# Patient Record
Sex: Female | Born: 1940 | Race: White | Hispanic: No | Marital: Single | Smoking: Never smoker
Health system: Southern US, Community
[De-identification: ages and names within clinical notes are randomized; demographics above are authoritative.]

## PROBLEM LIST (undated history)

## (undated) DIAGNOSIS — I639 Cerebral infarction, unspecified: Secondary | ICD-10-CM

## (undated) DIAGNOSIS — I10 Essential (primary) hypertension: Secondary | ICD-10-CM

## (undated) DIAGNOSIS — C801 Malignant (primary) neoplasm, unspecified: Secondary | ICD-10-CM

## (undated) DIAGNOSIS — N3010 Interstitial cystitis (chronic) without hematuria: Secondary | ICD-10-CM

## (undated) DIAGNOSIS — M810 Age-related osteoporosis without current pathological fracture: Secondary | ICD-10-CM

## (undated) DIAGNOSIS — M1990 Unspecified osteoarthritis, unspecified site: Secondary | ICD-10-CM

## (undated) DIAGNOSIS — E7800 Pure hypercholesterolemia, unspecified: Secondary | ICD-10-CM

## (undated) HISTORY — DX: Essential (primary) hypertension: I10

## (undated) HISTORY — DX: Cerebral infarction, unspecified: I63.9

## (undated) HISTORY — DX: Pure hypercholesterolemia, unspecified: E78.00

## (undated) HISTORY — DX: Interstitial cystitis (chronic) without hematuria: N30.10

## (undated) HISTORY — PX: TUBAL LIGATION: SHX77

## (undated) HISTORY — DX: Age-related osteoporosis without current pathological fracture: M81.0

## (undated) HISTORY — PX: OTHER SURGICAL HISTORY: SHX169

---

## 1998-05-08 ENCOUNTER — Other Ambulatory Visit: Admission: RE | Admit: 1998-05-08 | Discharge: 1998-05-08 | Payer: Self-pay | Admitting: *Deleted

## 1999-06-10 ENCOUNTER — Other Ambulatory Visit: Admission: RE | Admit: 1999-06-10 | Discharge: 1999-06-10 | Payer: Self-pay | Admitting: *Deleted

## 2000-07-22 ENCOUNTER — Other Ambulatory Visit: Admission: RE | Admit: 2000-07-22 | Discharge: 2000-07-22 | Payer: Self-pay | Admitting: *Deleted

## 2001-07-26 ENCOUNTER — Other Ambulatory Visit: Admission: RE | Admit: 2001-07-26 | Discharge: 2001-07-26 | Payer: Self-pay | Admitting: *Deleted

## 2003-06-29 ENCOUNTER — Other Ambulatory Visit: Admission: RE | Admit: 2003-06-29 | Discharge: 2003-06-29 | Payer: Self-pay | Admitting: *Deleted

## 2004-07-11 ENCOUNTER — Other Ambulatory Visit: Admission: RE | Admit: 2004-07-11 | Discharge: 2004-07-11 | Payer: Self-pay | Admitting: *Deleted

## 2004-10-25 ENCOUNTER — Encounter: Admission: RE | Admit: 2004-10-25 | Discharge: 2004-10-25 | Payer: Self-pay | Admitting: Internal Medicine

## 2005-08-14 ENCOUNTER — Other Ambulatory Visit: Admission: RE | Admit: 2005-08-14 | Discharge: 2005-08-14 | Payer: Self-pay | Admitting: *Deleted

## 2006-07-13 ENCOUNTER — Encounter: Admission: RE | Admit: 2006-07-13 | Discharge: 2006-10-11 | Payer: Self-pay | Admitting: Family Medicine

## 2007-03-10 ENCOUNTER — Other Ambulatory Visit: Admission: RE | Admit: 2007-03-10 | Discharge: 2007-03-10 | Payer: Self-pay | Admitting: *Deleted

## 2008-06-07 ENCOUNTER — Other Ambulatory Visit: Admission: RE | Admit: 2008-06-07 | Discharge: 2008-06-07 | Payer: Self-pay | Admitting: Gynecology

## 2009-12-05 ENCOUNTER — Encounter: Admission: RE | Admit: 2009-12-05 | Discharge: 2009-12-05 | Payer: Self-pay | Admitting: Internal Medicine

## 2009-12-18 ENCOUNTER — Encounter: Admission: RE | Admit: 2009-12-18 | Discharge: 2009-12-18 | Payer: Self-pay | Admitting: Surgery

## 2009-12-18 ENCOUNTER — Other Ambulatory Visit: Admission: RE | Admit: 2009-12-18 | Discharge: 2009-12-18 | Payer: Self-pay | Admitting: Interventional Radiology

## 2011-08-27 IMAGING — CT CT NECK W/ CM
4 of 5 series · 16 of 33 positions shown, 19 images · IV contrast (agent unspecified)
Comparison: None

CLINICAL DATA: Right neck pain and swelling.

CT NECK WITH CONTRAST
TECHNIQUE: Multidetector CT imaging of the neck was performed with
intravenous contrast.
Contrast: 70 ml 5mnipaque-JAA

[Series 3: axial neck · axial · 0.38mm/px · z∈[-2,+90]mm · 3 of 94 slices shown]
[im 19/94  bone]
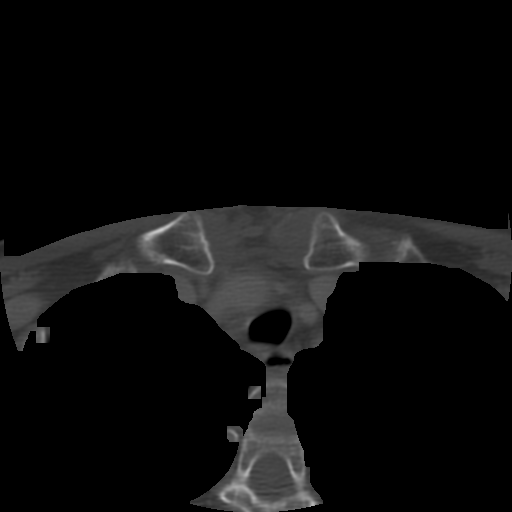
[im 38/94  bone]
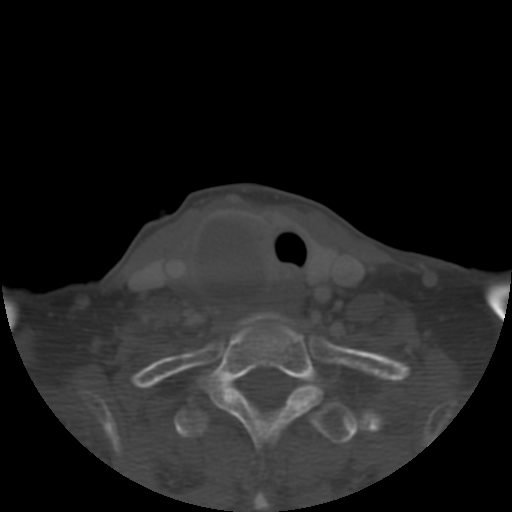
[im 56/94  bone]
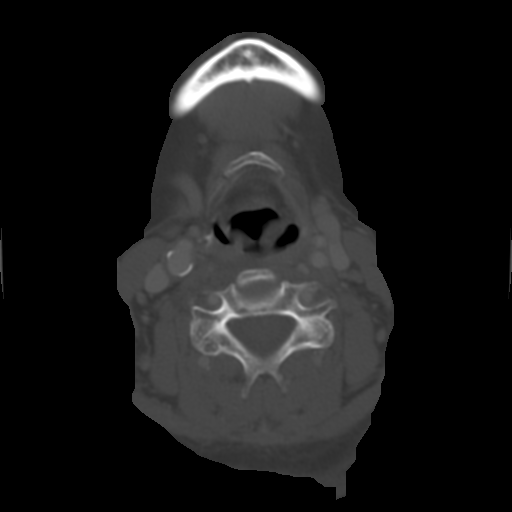

[Series 200: neck sag · sagittal · 0.47mm/px · 5 of 79 slices shown, 6 images]
[im 27/79  bone]
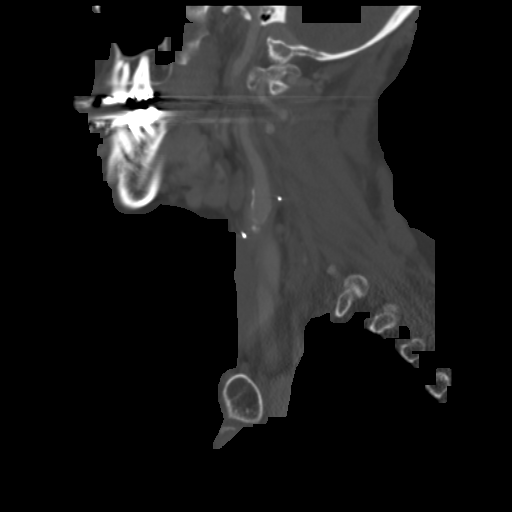
[im 33/79  bone]
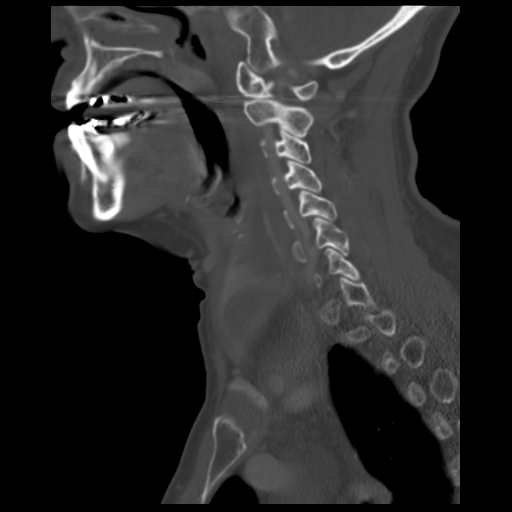
[im 40/79  soft-tissue]
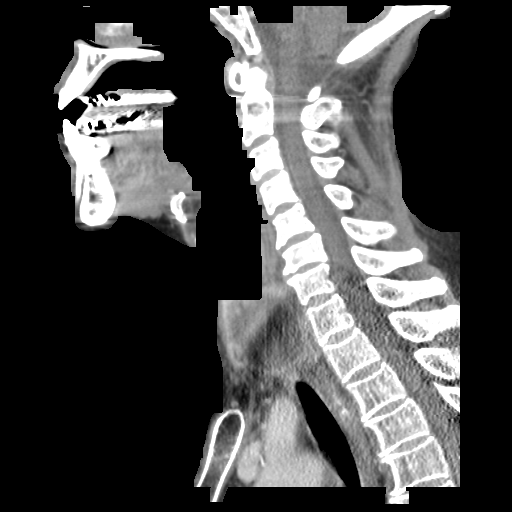
[im 40/79  bone]
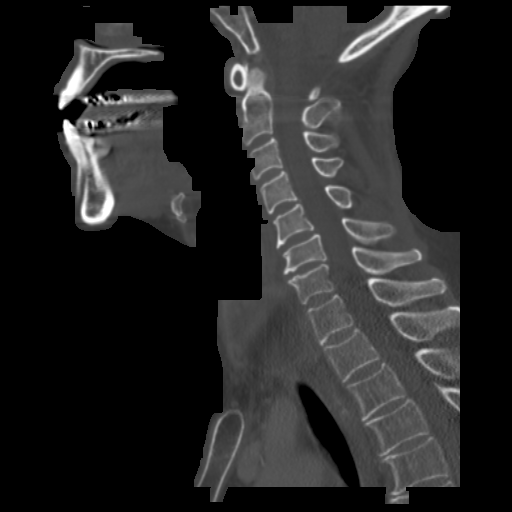
[im 46/79  bone]
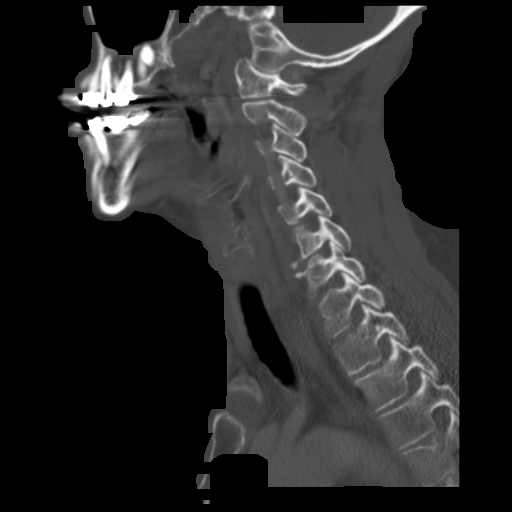
[im 53/79  bone]
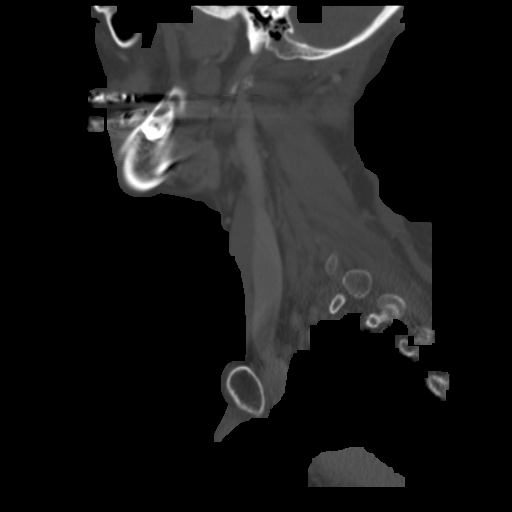

[Series 201: neck cor · coronal · 0.47mm/px · 3 of 75 slices shown]
[im 22/75  bone]
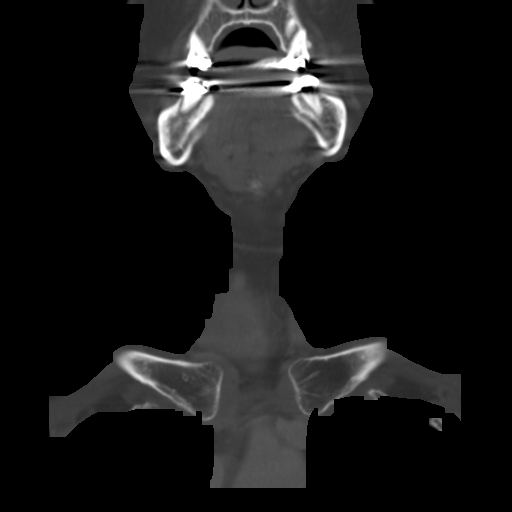
[im 32/75  bone]
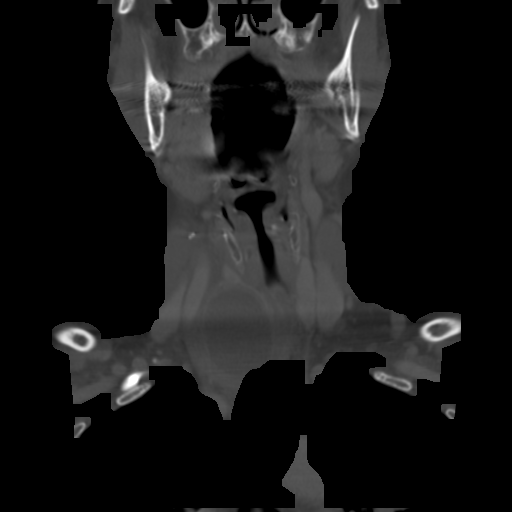
[im 42/75  bone]
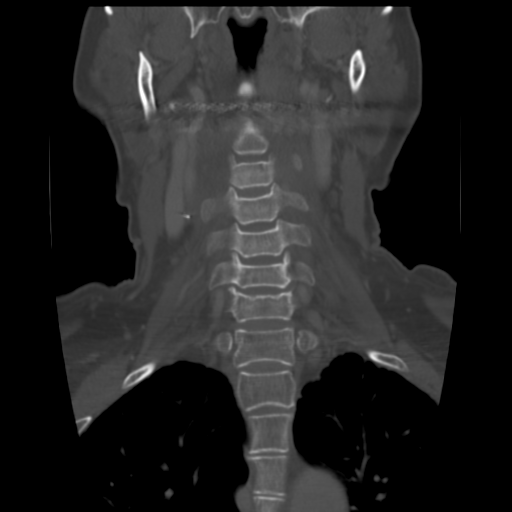

[Series 202: angled to hyoid · axial · 0.47mm/px · z∈[-41,+86]mm · 5 of 106 slices shown, 7 images]
[im 18/106  soft-tissue]
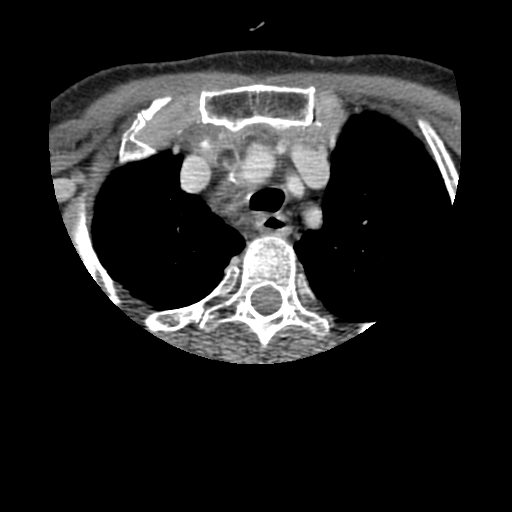
[im 18/106  bone]
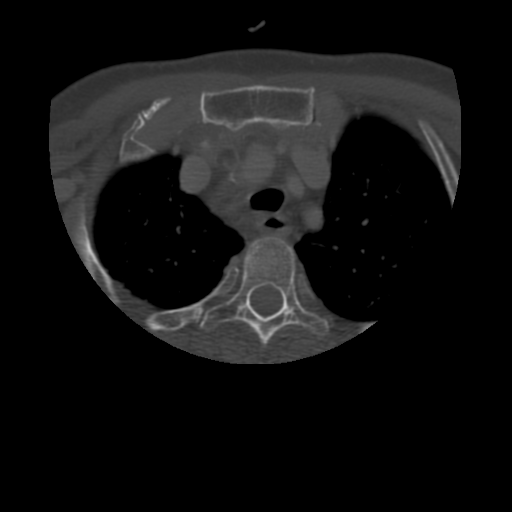
[im 36/106  bone]
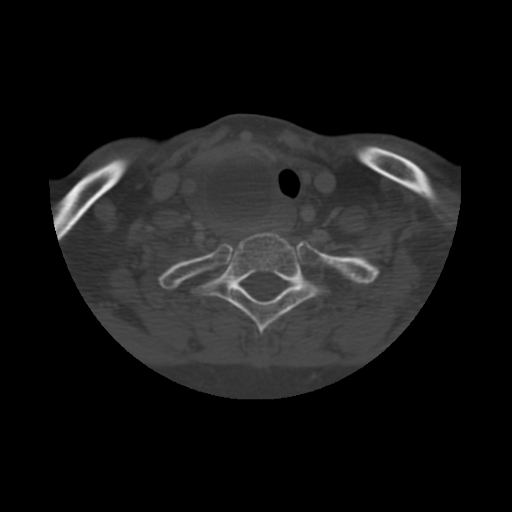
[im 53/106  bone]
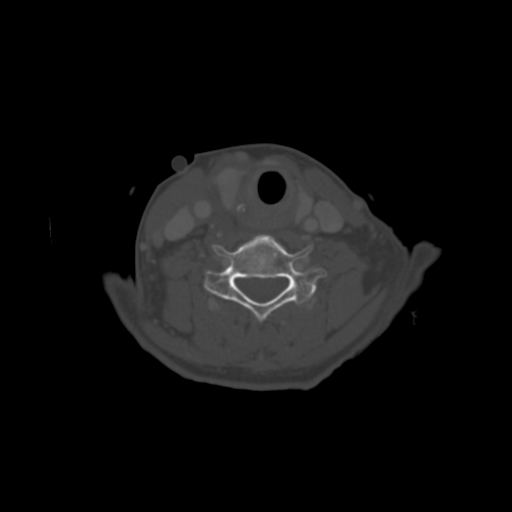
[im 71/106  bone]
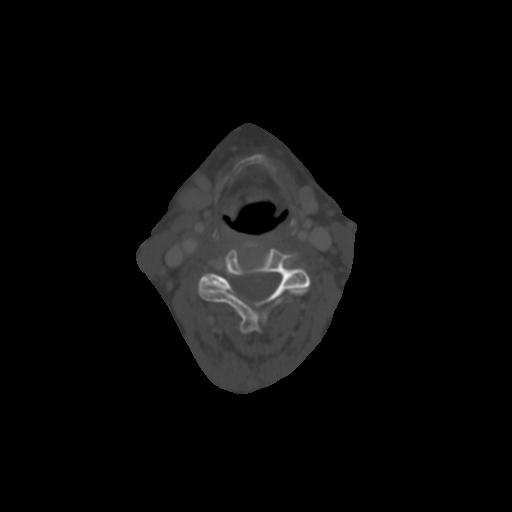
[im 88/106  soft-tissue]
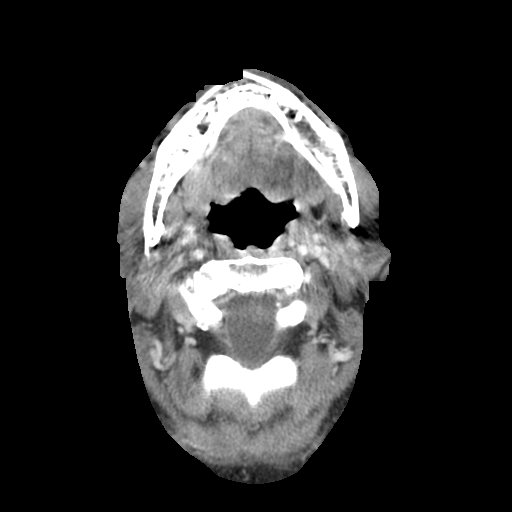
[im 88/106  bone]
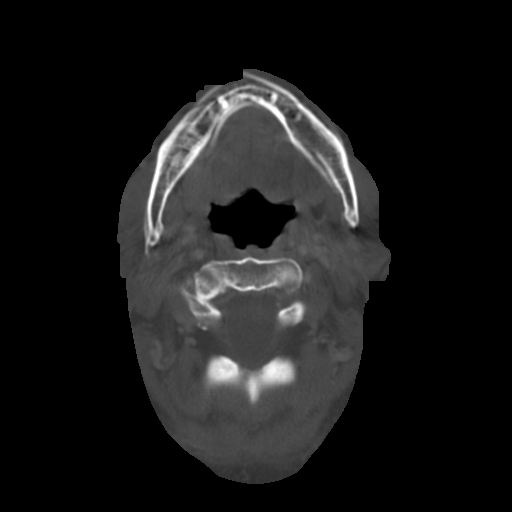

[16 of 33 positions shown; findings below may reference images not displayed]

FINDINGS: Apparently arising within the inferior aspect of the
right lobe of the thyroid, there is a cystic or necrotic lesion
measuring 4 cm AP, 3.5 cm right to left and 4.5 cm cephalocaudal.
Internal density appears homogeneous.  There does appear to be
perceptible wall thickness.  The differential diagnosis includes
necrotic tumor versus large degenerated adenoma versus abscess.
This exerts mass effect on the airway and the esophagus, deviating
them towards the left.  The right common carotid artery passes
along the margin of this lesion and could possibly be  adhesed.
The jugular vein is not affected.

The left lobe of the thyroid appears normal.

Posterior fossa intracranial structures appear unremarkable.
Parotid and submandibular glands appear normal.  No pathologically
enlarged lymph nodes are noted within the neck.  There is been
previous carotid endarterectomy.  The lung apices appear clear.
There is some fluid in the superior recess of the pericardium, not
completely evaluated.  Ordinary cervical spondylosis is noted at C5-
6 and C6-7.
IMPRESSION: Cystic or necrotic lesion apparently arising from the lower pole of
the thyroid on the right measuring 4 x 3.5 x 4.5 cm.  Displacement
of the trachea and the esophagus to the left.  This is worrisome
for a necrotic neoplasm.  The differential diagnosis also includes
degenerated adenoma and abscess.  See above for full discussion.

## 2014-12-27 ENCOUNTER — Other Ambulatory Visit: Payer: Self-pay | Admitting: Internal Medicine

## 2017-02-18 ENCOUNTER — Ambulatory Visit
Admission: RE | Admit: 2017-02-18 | Discharge: 2017-02-18 | Disposition: A | Discharge status: Home / Self Care | Payer: Medicare Other | Source: Ambulatory Visit | Attending: Internal Medicine | Admitting: Internal Medicine

## 2017-02-18 ENCOUNTER — Other Ambulatory Visit: Payer: Self-pay | Admitting: Internal Medicine

## 2017-02-18 DIAGNOSIS — I739 Peripheral vascular disease, unspecified: Principal | ICD-10-CM

## 2017-02-18 DIAGNOSIS — I779 Disorder of arteries and arterioles, unspecified: Secondary | ICD-10-CM

## 2018-03-24 ENCOUNTER — Other Ambulatory Visit: Payer: Self-pay | Admitting: Radiology

## 2018-03-26 ENCOUNTER — Telehealth: Payer: Self-pay | Admitting: Hematology and Oncology

## 2018-03-26 NOTE — Telephone Encounter (Signed)
Confirmed with patient morning BC appointment for 8/7, [facility] patient, letter/appointment reminder mailed

## 2018-03-29 ENCOUNTER — Encounter: Payer: Self-pay | Admitting: *Deleted

## 2018-03-29 DIAGNOSIS — D0512 Intraductal carcinoma in situ of left breast: Secondary | ICD-10-CM | POA: Insufficient documentation

## 2018-03-30 ENCOUNTER — Telehealth: Payer: Self-pay

## 2018-03-30 NOTE — Telephone Encounter (Signed)
Returned patient's call.  Patient wanted to clarify if she was able to eat or drink anything prior to her appointment.  Nurse informed patient she able to do so, voiced understanding.  No further needs at this time.

## 2018-03-31 ENCOUNTER — Encounter: Payer: Self-pay | Admitting: Hematology and Oncology

## 2018-03-31 ENCOUNTER — Ambulatory Visit: Payer: Medicare Other | Admitting: Physical Therapy

## 2018-03-31 ENCOUNTER — Ambulatory Visit
Admission: RE | Admit: 2018-03-31 | Discharge: 2018-03-31 | Disposition: A | Discharge status: Home / Self Care | Payer: Medicare Other | Source: Ambulatory Visit | Attending: Radiation Oncology | Admitting: Radiation Oncology

## 2018-03-31 ENCOUNTER — Encounter: Payer: Self-pay | Admitting: Radiation Oncology

## 2018-03-31 ENCOUNTER — Telehealth: Payer: Self-pay | Admitting: Hematology and Oncology

## 2018-03-31 ENCOUNTER — Inpatient Hospital Stay: Payer: Medicare Other | Attending: Hematology and Oncology | Admitting: Hematology and Oncology

## 2018-03-31 ENCOUNTER — Other Ambulatory Visit: Payer: Self-pay | Admitting: General Surgery

## 2018-03-31 ENCOUNTER — Inpatient Hospital Stay: Payer: Medicare Other

## 2018-03-31 DIAGNOSIS — D0512 Intraductal carcinoma in situ of left breast: Secondary | ICD-10-CM

## 2018-03-31 DIAGNOSIS — J3489 Other specified disorders of nose and nasal sinuses: Secondary | ICD-10-CM

## 2018-03-31 DIAGNOSIS — C50412 Malignant neoplasm of upper-outer quadrant of left female breast: Secondary | ICD-10-CM

## 2018-03-31 DIAGNOSIS — Z171 Estrogen receptor negative status [ER-]: Secondary | ICD-10-CM | POA: Insufficient documentation

## 2018-03-31 LAB — CBC WITH DIFFERENTIAL (CANCER CENTER ONLY)
Basophils Absolute: 0 10*3/uL (ref 0.0–0.1)
Basophils Relative: 1 %
Eosinophils Absolute: 0.1 10*3/uL (ref 0.0–0.5)
Eosinophils Relative: 1 %
HEMATOCRIT: 39 % (ref 34.8–46.6)
Hemoglobin: 12.9 g/dL (ref 11.6–15.9)
LYMPHS ABS: 2 10*3/uL (ref 0.9–3.3)
LYMPHS PCT: 28 %
MCH: 31.2 pg (ref 25.1–34.0)
MCHC: 33.1 g/dL (ref 31.5–36.0)
MCV: 94.2 fL (ref 79.5–101.0)
MONO ABS: 0.5 10*3/uL (ref 0.1–0.9)
Monocytes Relative: 7 %
NEUTROS ABS: 4.6 10*3/uL (ref 1.5–6.5)
Neutrophils Relative %: 63 %
Platelet Count: 345 10*3/uL (ref 145–400)
RBC: 4.14 MIL/uL (ref 3.70–5.45)
RDW: 13.5 % (ref 11.2–14.5)
WBC Count: 7.3 10*3/uL (ref 3.9–10.3)

## 2018-03-31 LAB — CMP (CANCER CENTER ONLY)
ALT: 15 U/L (ref 0–44)
ANION GAP: 11 (ref 5–15)
AST: 17 U/L (ref 15–41)
Albumin: 3.9 g/dL (ref 3.5–5.0)
Alkaline Phosphatase: 58 U/L (ref 38–126)
BUN: 20 mg/dL (ref 8–23)
CHLORIDE: 106 mmol/L (ref 98–111)
CO2: 24 mmol/L (ref 22–32)
Calcium: 9.2 mg/dL (ref 8.9–10.3)
Creatinine: 0.74 mg/dL (ref 0.44–1.00)
GFR, Est AFR Am: >60 mL/min (ref >60)
GFR, Estimated: >60 mL/min (ref >60)
GLUCOSE: 127 mg/dL — AB (ref 70–99)
POTASSIUM: 3.6 mmol/L (ref 3.5–5.1)
SODIUM: 141 mmol/L (ref 135–145)
TOTAL PROTEIN: 7.1 g/dL (ref 6.5–8.1)
Total Bilirubin: 0.6 mg/dL (ref 0.3–1.2)

## 2018-03-31 NOTE — Progress Notes (Signed)
[facility] NOTE  Patient Care Team: Leeroy Cha, MD as PCP - General (Internal Medicine) Stark Klein, MD as Consulting Physician (General Surgery) Nicholas Lose, MD as Consulting Physician (Hematology and Oncology) Kyung Rudd, MD as Consulting Physician (Radiation Oncology)  CHIEF COMPLAINTS/PURPOSE OF CONSULTATION:  Newly diagnosed left breast DCIS  HISTORY OF PRESENTING ILLNESS:  Cynthia Peterson 77 y.o. female is here because of recent diagnosis of left breast DCIS.  Patient had a routine screening mammogram that detected left breast calcifications in the upper outer quadrant.  These measured 1.5 cm.  Biopsy revealed high-grade DCIS with calcifications that was ER PR negative.  She was discussed this morning at the multidisciplinary tumor board and she is here today at the [facility] clinic to discuss her treatment plan.  She has a sister age 32 who had breast cancer.  I reviewed her records extensively and collaborated the history with the patient.  SUMMARY OF ONCOLOGIC HISTORY:   Ductal carcinoma in situ (DCIS) of left breast   03/24/2018 Initial Diagnosis    Screening detected left breast calcifications UOQ at 3 o'clock position pleomorphic in nature 1.5 cm, axilla negative, biopsy showed high-grade DCIS with calcifications ER 0%, PR 0%, Ki-67 10%, Tis NX stage 0        MEDICAL HISTORY:  Past Medical History:  Diagnosis Date  . Hypercholesteremia   . Hypertension   . Interstitial cystitis   . Osteoporosis   . Stroke [facility])     SURGICAL HISTORY: Past Surgical History:  Procedure Laterality Date  . carotid surgery    . TUBAL LIGATION      SOCIAL HISTORY: Social History   Socioeconomic History  . Marital status: Single    Spouse name: Not on file  . Number of children: Not on file  . Years of education: Not on file  . Highest education level: Not on file  Occupational History  . Not on file  Social Needs  . Financial resource  strain: Not on file  . Food insecurity:    Worry: Not on file    Inability: Not on file  . Transportation needs:    Medical: Not on file    Non-medical: Not on file  Tobacco Use  . Smoking status: Never Smoker  Substance and Sexual Activity  . Alcohol use: Not Currently  . Drug use: Never  . Sexual activity: Not on file  Lifestyle  . Physical activity:    Days per week: Not on file    Minutes per session: Not on file  . Stress: Not on file  Relationships  . Social connections:    Talks on phone: Not on file    Gets together: Not on file    Attends religious service: Not on file    Active member of club or organization: Not on file    Attends meetings of clubs or organizations: Not on file    Relationship status: Not on file  . Intimate partner violence:    Fear of current or ex partner: Not on file    Emotionally abused: Not on file    Physically abused: Not on file    Forced sexual activity: Not on file  Other Topics Concern  . Not on file  Social History Narrative  . Not on file    FAMILY HISTORY: Family History  Problem Relation Age of Onset  . Colon cancer Paternal Uncle   . Breast cancer Sister 77    ALLERGIES:  is  allergic to actonel [risedronate sodium]; macrodantin [nitrofurantoin macrocrystal]; robitet [tetracycline]; and sulfa antibiotics.  MEDICATIONS:  Current Outpatient Medications  Medication Sig Dispense Refill  . amLODipine (NORVASC) 5 MG tablet Take 5 mg by mouth daily.    Marland Kitchen aspirin 325 MG tablet Take 325 mg by mouth daily.    Marland Kitchen FLUoxetine (PROZAC) 20 MG tablet Take 20 mg by mouth daily.    . ramipril (ALTACE) 10 MG capsule Take 10 mg by mouth daily.    . simvastatin (ZOCOR) 20 MG tablet Take 20 mg by mouth daily.     No current facility-administered medications for this visit.     REVIEW OF SYSTEMS:   Constitutional: Denies fevers, chills or abnormal night sweats Eyes: Denies blurriness of vision, double vision or watery eyes Ears, nose,  mouth, throat, and face: Denies mucositis or sore throat Respiratory: Denies cough, dyspnea or wheezes Cardiovascular: Denies palpitation, chest discomfort or lower extremity swelling Gastrointestinal:  Denies nausea, heartburn or change in bowel habits Skin: Denies abnormal skin rashes Lymphatics: Denies new lymphadenopathy or easy bruising Neurological:Denies numbness, tingling or new weaknesses Behavioral/Psych: Mood is stable, no new changes  Breast:  Denies any palpable lumps or discharge All other systems were reviewed with the patient and are negative.  PHYSICAL EXAMINATION: ECOG PERFORMANCE STATUS: 0 - Asymptomatic  Vitals:   03/31/18 0844  BP: (!) 159/78  Pulse: 93  Resp: 17  Temp: 98.6 F (37 C)  SpO2: 98%   Filed Weights   03/31/18 0844  Weight: 124 lb 3.2 oz (56.3 kg)    GENERAL:alert, no distress and comfortable SKIN: skin color, texture, turgor are normal, no rashes or significant lesions EYES: normal, conjunctiva are pink and non-injected, sclera clear OROPHARYNX:no exudate, no erythema and lips, buccal mucosa, and tongue normal  NECK: supple, thyroid normal size, non-tender, without nodularity LYMPH:  no palpable lymphadenopathy in the cervical, axillary or inguinal LUNGS: clear to auscultation and percussion with normal breathing effort HEART: regular rate & rhythm and no murmurs and no lower extremity edema ABDOMEN:abdomen soft, non-tender and normal bowel sounds Musculoskeletal:no cyanosis of digits and no clubbing  PSYCH: alert & oriented x 3 with fluent speech NEURO: no focal motor/sensory deficits BREAST: No palpable nodules in breast. No palpable axillary or supraclavicular lymphadenopathy (exam performed in the presence of a chaperone)   LABORATORY DATA:  I have reviewed the data as listed Lab Results  Component Value Date   WBC 7.3 03/31/2018   HGB 12.9 03/31/2018   HCT 39.0 03/31/2018   MCV 94.2 03/31/2018   PLT 345 03/31/2018   Lab  Results  Component Value Date   NA 141 03/31/2018   K 3.6 03/31/2018   CL 106 03/31/2018   CO2 24 03/31/2018    RADIOGRAPHIC STUDIES: I have personally reviewed the radiological reports and agreed with the findings in the report.  ASSESSMENT AND PLAN:  Ductal carcinoma in situ (DCIS) of left breast 03/24/2018:Screening detected left breast calcifications UOQ at 3 o'clock position pleomorphic in nature 1.5 cm, axilla negative, biopsy showed high-grade DCIS with calcifications ER 0%, PR 0%, Ki-67 10%, Tis NX stage 0  Pathology review: I discussed with the patient the difference between DCIS and invasive breast cancer. It is considered a precancerous lesion. DCIS is classified as a 0. It is generally detected through mammograms as calcifications. We discussed the significance of grades and its impact on prognosis. We also discussed the importance of ER and PR receptors and their implications to adjuvant treatment options.  Prognosis of DCIS dependence on grade, comedo necrosis. It is anticipated that if not treated, 20-30% of DCIS can develop into invasive breast cancer.  Recommendation: 1. Breast conserving surgery 2. Followed by adjuvant radiation therapy No role of antiestrogen therapy because she is ER PR negative.  Return to clinic after surgery to discuss the final pathology report and come up with an adjuvant treatment plan.     All questions were answered. The patient knows to call the clinic with any problems, questions or concerns.    Harriette Ohara, MD 03/31/18

## 2018-03-31 NOTE — Assessment & Plan Note (Signed)
03/24/2018:Screening detected left breast calcifications UOQ at 3 o'clock position pleomorphic in nature 1.5 cm, axilla negative, biopsy showed high-grade DCIS with calcifications ER 0%, PR 0%, Ki-67 10%, Tis NX stage 0  Pathology review: I discussed with the patient the difference between DCIS and invasive breast cancer. It is considered a precancerous lesion. DCIS is classified as a 0. It is generally detected through mammograms as calcifications. We discussed the significance of grades and its impact on prognosis. We also discussed the importance of ER and PR receptors and their implications to adjuvant treatment options. Prognosis of DCIS dependence on grade, comedo necrosis. It is anticipated that if not treated, 20-30% of DCIS can develop into invasive breast cancer.  Recommendation: 1. Breast conserving surgery 2. Followed by adjuvant radiation therapy No role of antiestrogen therapy because she is ER PR negative.  Return to clinic after surgery to discuss the final pathology report and come up with an adjuvant treatment plan.

## 2018-03-31 NOTE — Telephone Encounter (Signed)
Per 8/7 los, no orders.

## 2018-03-31 NOTE — Progress Notes (Signed)
Radiation Oncology         [phone] ________________________________  Name: Cynthia Peterson        MRN: [id number]  Date of Service: 03/31/2018 DOB: [birth year]  ER:[id number], Ronie Spies, MD  Leeroy Cha,*     REFERRING PHYSICIAN: Leeroy Cha,*   DIAGNOSIS: The encounter diagnosis was Ductal carcinoma in situ (DCIS) of left breast.   HISTORY OF PRESENT ILLNESS: Cynthia Peterson is a 77 y.o. female seen in the multidisciplinary breast clinic for a new diagnosis of left breast cancer. The patient was noted to have screening detected calcifications of the left breast. She proceeded to diagnostic imaging which revealed a span of calcifications at 3:00 in the left breast spanning 1.5 cm. Her axilla was negative for adenopathy. She underwent a biopsy on 03/24/18 that revealed a high grade DCIS with calcifications, ER/PR was negative, and there was question of microinvasion initially which is why a Ki 67 was performed and was 10%. Her case ultimately was signed off as non invasive disease and she comes today to discuss options of treatment for her breast cancer.    PREVIOUS RADIATION THERAPY: No   PAST MEDICAL HISTORY:  Past Medical History:  Diagnosis Date  . Hypercholesteremia   . Hypertension   . Interstitial cystitis   . Osteoporosis   . Stroke [facility])        PAST SURGICAL HISTORY: Past Surgical History:  Procedure Laterality Date  . carotid surgery    . TUBAL LIGATION       FAMILY HISTORY:  Family History  Problem Relation Age of Onset  . Colon cancer Paternal Uncle   . Breast cancer Sister 107     SOCIAL HISTORY:  reports that she has never smoked. She does not have any smokeless tobacco history on file. She reports that she drank alcohol. She reports that she does not use drugs. The patient is single and lives in [place]. She is retired and takes care of grandchildren. She has 3 daughters and 7 grand children.   ALLERGIES: Actonel  [risedronate sodium]; Macrodantin [nitrofurantoin macrocrystal]; Robitet [tetracycline]; and Sulfa antibiotics   MEDICATIONS:  Current Outpatient Medications  Medication Sig Dispense Refill  . amLODipine (NORVASC) 5 MG tablet Take 5 mg by mouth daily.    Marland Kitchen aspirin 325 MG tablet Take 325 mg by mouth daily.    Marland Kitchen FLUoxetine (PROZAC) 20 MG tablet Take 20 mg by mouth daily.    . ramipril (ALTACE) 10 MG capsule Take 10 mg by mouth daily.    . simvastatin (ZOCOR) 20 MG tablet Take 20 mg by mouth daily.     No current facility-administered medications for this encounter.      REVIEW OF SYSTEMS: On review of systems, the patient reports that she is doing well overall. She notes a crusting lesion that oozes periodically along her right nostril that she states has been oozing since January off and on. She is scheduled to see dermatology in October to assess this. She denies any chest pain, shortness of breath, cough, fevers, chills, night sweats, unintended weight changes. She denies any bowel or bladder disturbances, and denies abdominal pain, nausea or vomiting. She denies any new musculoskeletal or joint aches or pains. A complete review of systems is obtained and is otherwise negative.     PHYSICAL EXAM:  Wt Readings from Last 3 Encounters:  03/31/18 124 lb 3.2 oz (56.3 kg)   Temp Readings from Last 3 Encounters:  03/31/18 98.6 F (37 C) (Oral)  BP Readings from Last 3 Encounters:  03/31/18 (!) 159/78   Pulse Readings from Last 3 Encounters:  03/31/18 93     In general this is a well appearing caucasian female in no acute distress. She is alert and oriented x4 and appropriate throughout the examination. HEENT reveals that the patient is normocephalic, atraumatic. EOMs are intact. PERRLA. Skin is intact though she has a lesion in the right nostril that is about 6 mm and crusted. Cardiovascular exam reveals a regular rate and rhythm, no clicks rubs or murmurs are auscultated. Chest is  clear to auscultation bilaterally. Lymphatic assessment is performed and does not reveal any adenopathy in the cervical, supraclavicular, axillary, or inguinal chains. Bilateral breast exam is performed and reveals fullness deep to the left breast biopsy site with mild ecchymosis. Her right breast does not reveal any palpable masses, neither nipple has any retraction, bleeding, or discharge. Abdomen has active bowel sounds in all quadrants and is intact. The abdomen is soft, non tender, non distended. Lower extremities are negative for pretibial pitting edema, deep calf tenderness, cyanosis or clubbing.   ECOG = 0  0 - Asymptomatic (Fully active, able to carry on all predisease activities without restriction)  1 - Symptomatic but completely ambulatory (Restricted in physically strenuous activity but ambulatory and able to carry out work of a light or sedentary nature. For example, light housework, office work)  2 - Symptomatic, <50% in bed during the day (Ambulatory and capable of all self care but unable to carry out any work activities. Up and about more than 50% of waking hours)  3 - Symptomatic, >50% in bed, but not bedbound (Capable of only limited self-care, confined to bed or chair 50% or more of waking hours)  4 - Bedbound (Completely disabled. Cannot carry on any self-care. Totally confined to bed or chair)  5 - Death   Eustace Pen MM, Creech RH, Tormey DC, et al. [phone]). "Toxicity and response criteria of the [facility]". [place] Oncol. 5 (6): 649-55    LABORATORY DATA:  Lab Results  Component Value Date   WBC 7.3 03/31/2018   HGB 12.9 03/31/2018   HCT 39.0 03/31/2018   MCV 94.2 03/31/2018   PLT 345 03/31/2018   Lab Results  Component Value Date   NA 141 03/31/2018   K 3.6 03/31/2018   CL 106 03/31/2018   CO2 24 03/31/2018   Lab Results  Component Value Date   ALT 15 03/31/2018   AST 17 03/31/2018   ALKPHOS 58 03/31/2018   BILITOT 0.6  03/31/2018      RADIOGRAPHY: No results found.     IMPRESSION/PLAN: 1. 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 Dr. Lisbeth Renshaw discusses the pathology findings and reviews the nature of non invasive breast disease. The consensus from the breast conference includes breast conservation with lumpectomy. The patient's course would then be followed by external radiotherapy to the breast. We discussed the risks, benefits, short, and long term effects of radiotherapy, and the patient is interested in proceeding. Dr. Lisbeth Renshaw discusses the delivery and logistics of radiotherapy and anticipates a course of 4 weeks of radiotherapy. We will see her back about 2 weeks after surgery to discuss the simulation process and anticipate we starting radiotherapy about 4-6 weeks after surgery.  2. Right nostril lesion. The patient will follow up with her dermatology appointment she has scheduled though it's not until October. She is planning to try to see if this can  be any sooner.   The above documentation reflects my direct findings during this shared patient visit. Please see the separate note by Dr. Lisbeth Renshaw on this date for the remainder of the patient's plan of care.    Carola Rhine, PAC

## 2018-03-31 NOTE — Progress Notes (Signed)
Nutrition Assessment  Reason for Assessment:  Pt seen in Breast Clinic  ASSESSMENT:   77 year old female with new diagnosis of breast cancer.  Past medical history high cholesterol, interstitial cystitis, stroke, osteoporosis.    Patient reports normal appetite  Medications:  reviewed  Labs: reviewed  Anthropometrics:   Height: 64 inches Weight: 124 lb BMI: 21   NUTRITION DIAGNOSIS: Food and nutrition related knowledge deficit related to new diagnosis of breast cancer as evidenced by no prior need for nutrition related information.  INTERVENTION:   Discussed and provided packet of information regarding nutritional tips for breast cancer patients.  Questions answered.  Teachback method used.  Contact information provided and patient knows to contact me with questions/concerns.    MONITORING, EVALUATION, and GOAL: Pt will consume a healthy plant based diet to maintain lean body mass throughout treatment.   Cynthia Peterson B. Zenia Resides, [place], [facility] Registered Dietitian [phone] (pager)

## 2018-04-08 ENCOUNTER — Telehealth: Payer: Self-pay | Admitting: *Deleted

## 2018-04-08 NOTE — Telephone Encounter (Signed)
  Oncology Nurse Navigator Documentation  Navigator Location: [facility] (04/08/18 1300)   )Navigator Encounter Type: Telephone;MDC Follow-up (04/08/18 1300) Telephone: Outgoing Call;Clinic/MDC Follow-up (04/08/18 1300)                                                  Time Spent with Patient: 15 (04/08/18 1300)

## 2018-04-30 ENCOUNTER — Telehealth: Payer: Self-pay | Admitting: Hematology and Oncology

## 2018-04-30 ENCOUNTER — Other Ambulatory Visit: Payer: Self-pay | Admitting: *Deleted

## 2018-04-30 DIAGNOSIS — D0512 Intraductal carcinoma in situ of left breast: Secondary | ICD-10-CM

## 2018-04-30 NOTE — Telephone Encounter (Signed)
Called regarding 10/3

## 2018-05-06 NOTE — Pre-Procedure Instructions (Addendum)
Cynthia Peterson  05/06/2018      [place], [place] [address] Phone: [phone] Fax: [place], [place] [facility] [address] [place] [address] [place] [address] Phone: [phone] Fax: [phone]    Your procedure is scheduled on Tuesday September 17.  Report to [facility] Admitting at 5:30 A.M.  Call this number if you have problems the morning of surgery:  [phone]   Remember:  Do not eat or drink after midnight.  You may drink clear liquids (water, gatorade, black coffee, sprite, ginger ale) until 3 hours prior to your surgery time (4:30AM). Do not drink after 4:30 AM the day of surgery.    Take these medicines the morning of surgery with A SIP OF WATER:   Amlodipine (norvasc) Fluoxetine (prozac)  7 days prior to surgery STOP taking any Aspirin(unless otherwise instructed by your surgeon), Aleve, Naproxen, Ibuprofen, Motrin, Advil, Goody's, BC's, all herbal medications, fish oil, and all vitamins     Do not wear jewelry, make-up or nail polish.  Do not wear lotions, powders, or perfumes, or deodorant.  Do not shave 48 hours prior to surgery.  Men may shave face and neck.  Do not bring valuables to the hospital.  [facility] is not responsible for any belongings or valuables.  Contacts, dentures or bridgework may not be worn into surgery.  Leave your suitcase in the car.  After surgery it may be brought to your room.  For patients admitted to the hospital, discharge time will be determined by your treatment team.  Patients discharged the day of surgery will not be allowed to drive home.   Special instructions:    [facility]- Preparing For Surgery  Before surgery, you can play an important role. Because skin is not sterile, your skin needs to be as free of germs as possible. You can reduce the number of  germs on your skin by washing with CHG (chlorahexidine gluconate) Soap before surgery.  CHG is an antiseptic cleaner which kills germs and bonds with the skin to continue killing germs even after washing.    Oral Hygiene is also important to reduce your risk of infection.  Remember - BRUSH YOUR TEETH THE MORNING OF SURGERY WITH YOUR REGULAR TOOTHPASTE  Please do not use if you have an allergy to CHG or antibacterial soaps. If your skin becomes reddened/irritated stop using the CHG.  Do not shave (including legs and underarms) for at least 48 hours prior to first CHG shower. It is OK to shave your face.  Please follow these instructions carefully.   1. Shower the NIGHT BEFORE SURGERY and the MORNING OF SURGERY with CHG.   2. If you chose to wash your hair, wash your hair first as usual with your normal shampoo.  3. After you shampoo, rinse your hair and body thoroughly to remove the shampoo.  4. Use CHG as you would any other liquid soap. You can apply CHG directly to the skin and wash gently with a scrungie or a clean washcloth.   5. Apply the CHG Soap to your body ONLY FROM THE NECK DOWN.  Do not use on open wounds or open sores. Avoid contact with your eyes, ears, mouth and genitals (private parts). Wash Face and genitals (private parts)  with your normal soap.  6. Wash thoroughly, paying special attention to  the area where your surgery will be performed.  7. Thoroughly rinse your body with warm water from the neck down.  8. DO NOT shower/wash with your normal soap after using and rinsing off the CHG Soap.  9. Pat yourself dry with a CLEAN TOWEL.  10. Wear CLEAN PAJAMAS to bed the night before surgery, wear comfortable clothes the morning of surgery  11. Place CLEAN SHEETS on your bed the night of your first shower and DO NOT SLEEP WITH PETS.    Day of Surgery:  Do not apply any deodorants/lotions.  Please wear clean clothes to the hospital/surgery center.   Remember to brush  your teeth WITH YOUR REGULAR TOOTHPASTE.    Please read over the following fact sheets that you were given. Coughing and Deep Breathing and Surgical Site Infection Prevention

## 2018-05-07 ENCOUNTER — Other Ambulatory Visit: Payer: Self-pay

## 2018-05-07 ENCOUNTER — Encounter (HOSPITAL_COMMUNITY): Payer: Self-pay | Admitting: Urology

## 2018-05-07 ENCOUNTER — Encounter (HOSPITAL_COMMUNITY)
Admission: RE | Admit: 2018-05-07 | Discharge: 2018-05-07 | Disposition: A | Discharge status: Home / Self Care | Payer: Medicare Other | Source: Ambulatory Visit | Attending: General Surgery | Admitting: General Surgery

## 2018-05-07 DIAGNOSIS — Z01818 Encounter for other preprocedural examination: Secondary | ICD-10-CM | POA: Insufficient documentation

## 2018-05-07 DIAGNOSIS — I10 Essential (primary) hypertension: Secondary | ICD-10-CM | POA: Diagnosis not present

## 2018-05-07 DIAGNOSIS — I498 Other specified cardiac arrhythmias: Secondary | ICD-10-CM | POA: Insufficient documentation

## 2018-05-07 HISTORY — DX: Malignant (primary) neoplasm, unspecified: C80.1

## 2018-05-07 HISTORY — DX: Unspecified osteoarthritis, unspecified site: M19.90

## 2018-05-07 LAB — CBC
HCT: 42.3 % (ref 36.0–46.0)
HEMOGLOBIN: 13.6 g/dL (ref 12.0–15.0)
MCH: 30.8 pg (ref 26.0–34.0)
MCHC: 32.2 g/dL (ref 30.0–36.0)
MCV: 95.7 fL (ref 78.0–100.0)
PLATELETS: 364 10*3/uL (ref 150–400)
RBC: 4.42 MIL/uL (ref 3.87–5.11)
RDW: 13.2 % (ref 11.5–15.5)
WBC: 7.6 10*3/uL (ref 4.0–10.5)

## 2018-05-07 LAB — BASIC METABOLIC PANEL
ANION GAP: 10 (ref 5–15)
BUN: 17 mg/dL (ref 8–23)
CHLORIDE: 105 mmol/L (ref 98–111)
CO2: 26 mmol/L (ref 22–32)
Calcium: 9.1 mg/dL (ref 8.9–10.3)
Creatinine, Ser: 0.69 mg/dL (ref 0.44–1.00)
GFR calc Af Amer: >60 mL/min (ref >60)
GFR calc non Af Amer: >60 mL/min (ref >60)
Glucose, Bld: 128 mg/dL — ABNORMAL HIGH (ref 70–99)
POTASSIUM: 3.6 mmol/L (ref 3.5–5.1)
SODIUM: 141 mmol/L (ref 135–145)

## 2018-05-07 NOTE — Progress Notes (Signed)
PCP - Dr. Fara Olden Cardiologist - denies cardiologist  EKG - 05/07/2018  ECHO - 70 yr ago after stroke  Aspirin Instructions: pt was not given instructions on stopping ASA. Pt will call Dr. Marlowe Aschoff office for instructions.    Patient denies shortness of breath, fever, cough and chest pain at PAT appointment   Patient verbalized understanding of instructions that were given to them at the PAT appointment. Patient was also instructed that they will need to review over the PAT instructions again at home before surgery.

## 2018-05-10 MED ORDER — CEFAZOLIN SODIUM-DEXTROSE 2-4 GM/100ML-% IV SOLN
2.0000 g | INTRAVENOUS | Status: AC
  Filled 2018-05-10: qty 100

## 2018-05-10 MED ORDER — GABAPENTIN 300 MG PO CAPS
300.0000 mg | ORAL_CAPSULE | ORAL | Status: AC
  Filled 2018-05-10: qty 1

## 2018-05-10 MED ORDER — ACETAMINOPHEN 500 MG PO TABS
1000.0000 mg | ORAL_TABLET | ORAL | Status: AC
  Filled 2018-05-10: qty 2

## 2018-05-10 NOTE — Anesthesia Preprocedure Evaluation (Addendum)
Anesthesia Evaluation  Patient identified by MRN, date of birth, ID band Patient awake    Reviewed: Allergy & Precautions, NPO status , Patient's Chart, lab work & pertinent test results  History of Anesthesia Complications Negative for: history of anesthetic complications  Airway Mallampati: II  TM Distance: >3 FB Neck ROM: Full    Dental  (+) Dental Advisory Given   Pulmonary neg pulmonary ROS,    Pulmonary exam normal        Cardiovascular hypertension, Pt. on medications Normal cardiovascular exam     Neuro/Psych CVA negative psych ROS   GI/Hepatic negative GI ROS, Neg liver ROS,   Endo/Other  negative endocrine ROS  Renal/GU negative Renal ROS     Musculoskeletal negative musculoskeletal ROS (+)   Abdominal   Peds  Hematology negative hematology ROS (+)   Anesthesia Other Findings Day of surgery medications reviewed with the patient.  Reproductive/Obstetrics                            Anesthesia Physical Anesthesia Plan  ASA: III  Anesthesia Plan: General   Post-op Pain Management:    Induction: Intravenous  PONV Risk Score and Plan: 3 and Ondansetron, Dexamethasone and Diphenhydramine  Airway Management Planned: LMA  Additional Equipment:   Intra-op Plan:   Post-operative Plan: Extubation in OR  Informed Consent: I have reviewed the patients History and Physical, chart, labs and discussed the procedure including the risks, benefits and alternatives for the proposed anesthesia with the patient or authorized representative who has indicated his/her understanding and acceptance.   Dental advisory given  Plan Discussed with: CRNA and Anesthesiologist  Anesthesia Plan Comments:        Anesthesia Quick Evaluation

## 2018-05-10 NOTE — H&P (Signed)
Cynthia Peterson  Location: [facility] Patient #: [id number] DOB: [birth year] Undefined / Language: Cleophus Molt / Race: White Female   History of Present Illness The patient is a 77 year old female who presents with breast cancer. Pt is a 77 yo F who presents wtih screening detected calcifications. She had diagnostic imaging which showed a span of calcifications of 1.5 cm at 3 o'clock. 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 There was an area suspicion for microinvasion, and that was sent for Ki 67 which was 10%. There was insufficient tissue for her 2.   She has family history of a sister dx wtih breast cancer at age 49. She has an uncle with colon cancer dx age 22. She had menarche at age 37. She had menopause at age 31. She used hormonal contraception for 1 year. She is a G3P3 wtih first child at age 52. She had a colonoscopy 5 years ago and a bone density scan in March of this year.   pathology 03/24/18 Diagnosis Breast, left, needle core biopsy, calcs @ 2 o'clock - 2:30 o'clock post depth - DUCTAL CARCINOMA IN SITU WITH CALCIFICATIONS. - SEE COMMENT. Microscopic Comment The carcinoma is high grade. There are a few foci suspicious for stromal invasion. IMMUNOHISTOCHEMICAL AND MORPHOMETRIC ANALYSIS PERFORMED MANUALLY Estrogen Receptor: 0%, NEGATIVE Progesterone Receptor: 0%, NEGATIVE Proliferation Marker Ki67: 10% FINAL DIAGNOSIS   Labs - CBC and CMET essentially normal.    Past Surgical History  Breast Biopsy  Left. Carotid Artery Surgery  Right. Tonsillectomy   Diagnostic Studies History Colonoscopy  5-10 years ago Mammogram  within last year Pap Smear  >5 years ago  Medication History Medications Reconciled  Social History  Caffeine use  Coffee. No alcohol use  No drug use  Tobacco use  Never smoker.  Family History Breast Cancer  Sister. Cerebrovascular Accident  Sister. Colon Cancer   Family Members In General. Heart Disease  Father. Prostate Cancer  Father.  Other Problems  Cerebrovascular Accident  Heart murmur  Hemorrhoids  High blood pressure  Hypercholesterolemia  Migraine Headache     Review of Systems  General Not Present- Appetite Loss, Chills, Fatigue, Fever, Night Sweats, Weight Gain and Weight Loss. Skin Not Present- Change in Wart/Mole, Dryness, Hives, Jaundice, New Lesions, Non-Healing Wounds, Rash and Ulcer. HEENT Present- Wears glasses/contact lenses. Not Present- Earache, Hearing Loss, Hoarseness, Nose Bleed, Oral Ulcers, Ringing in the Ears, Seasonal Allergies, Sinus Pain, Sore Throat, Visual Disturbances and Yellow Eyes. Respiratory Not Present- Bloody sputum, Chronic Cough, Difficulty Breathing, Snoring and Wheezing. Breast Not Present- Breast Mass, Breast Pain, Nipple Discharge and Skin Changes. Cardiovascular Not Present- Chest Pain, Difficulty Breathing Lying Down, Leg Cramps, Palpitations, Rapid Heart Rate, Shortness of Breath and Swelling of Extremities. Gastrointestinal Present- Hemorrhoids. Not Present- Abdominal Pain, Bloating, Bloody Stool, Change in Bowel Habits, Chronic diarrhea, Constipation, Difficulty Swallowing, Excessive gas, Gets full quickly at meals, Indigestion, Nausea, Rectal Pain and Vomiting. Female Genitourinary Not Present- Frequency, Nocturia, Painful Urination, Pelvic Pain and Urgency. Musculoskeletal Not Present- Back Pain, Joint Pain, Joint Stiffness, Muscle Pain, Muscle Weakness and Swelling of Extremities. Neurological Not Present- Decreased Memory, Fainting, Headaches, Numbness, Seizures, Tingling, Tremor, Trouble walking and Weakness. Psychiatric Not Present- Anxiety, Bipolar, Change in Sleep Pattern, Depression, Fearful and Frequent crying. Endocrine Not Present- Cold Intolerance, Excessive Hunger, Hair Changes, Heat Intolerance, Hot flashes and New Diabetes. Hematology Present- Blood Thinners and Easy  Bruising. Not Present- Excessive bleeding, Gland problems, HIV and Persistent  Infections.  Vitals  Weight: 124.2 lb Height: 64in Body Surface Area: 1.6 m Body Mass Index: 21.32 kg/m  Temp.: 98.25F  Pulse: 93 (Regular)  Resp.: 17 (Unlabored)  BP: 159/78 (Sitting, Left Arm, Standard)       Physical Exam General Mental Status-Alert. General Appearance-Consistent with stated age. Hydration-Well hydrated. Voice-Normal.  Head and Neck Head-normocephalic, atraumatic with no lesions or palpable masses. Trachea-midline. Thyroid Gland Characteristics - normal size and consistency. Note: mole on right nose that appears benign. Some bruising over the top.   Eye Eyeball - Bilateral-Extraocular movements intact. Sclera/Conjunctiva - Bilateral-No scleral icterus.  Chest and Lung Exam Chest and lung exam reveals -quiet, even and easy respiratory effort with no use of accessory muscles and on auscultation, normal breath sounds, no adventitious sounds and normal vocal resonance. Inspection Chest Wall - Normal. Back - normal.  Breast Note: breasts wtih heterogeneous density. No palpable mass. No skin dimpling. Faint bruising left lateral breast. no nipple retraction or skin dimpling. no LAD. breasts relatively symmetric.   Cardiovascular Cardiovascular examination reveals -normal heart sounds, regular rate and rhythm with no murmurs and normal pedal pulses bilaterally.  Abdomen Inspection Inspection of the abdomen reveals - No Hernias. Palpation/Percussion Palpation and Percussion of the abdomen reveal - Soft, Non Tender, No Rebound tenderness, No Rigidity (guarding) and No hepatosplenomegaly. Auscultation Auscultation of the abdomen reveals - Bowel sounds normal.  Neurologic Neurologic evaluation reveals -alert and oriented x 3 with no impairment of recent or remote memory. Mental Status-Normal.  Musculoskeletal Global Assessment  -Note: no gross deformities.  Normal Exam - Left-Upper Extremity Strength Normal and Lower Extremity Strength Normal. Normal Exam - Right-Upper Extremity Strength Normal and Lower Extremity Strength Normal.  Lymphatic Head & Neck  General Head & Neck Lymphatics: Bilateral - Description - Normal. Axillary  General Axillary Region: Bilateral - Description - Normal. Tenderness - Non Tender. Femoral & Inguinal  Generalized Femoral & Inguinal Lymphatics: Bilateral - Description - No Generalized lymphadenopathy.    Assessment & Plan MALIGNANT NEOPLASM OF UPPER-INNER QUADRANT OF LEFT BREAST IN FEMALE, ESTROGEN RECEPTOR NEGATIVE (C50.212) Impression: Pt has a new diagnosis of a cTis left breast cancer. This is amenable to breast conservation. There was a suspicion for microinvasion, but given her age, I would not consider SLN bx unless there was a significant invasive component.  I discussed seed localized lumpectomy wtih the patient. The surgical procedure was described to the patient. I discussed the incision type and location and that we would need radiology involved on with a wire or seed marker and/or sentinel node.  The risks and benefits of the procedure were described to the patient and she wishes to proceed.  We discussed the risks bleeding, infection, damage to other structures, need for further procedures/surgeries. We discussed the risk of seroma. The patient was advised if the area in the breast in cancer, we may need to go back to surgery for additional tissue to obtain negative margins or for a lymph node biopsy. The patient was advised that these are the most common complications, but that others can occur as well. They were advised against taking aspirin or other anti-inflammatory agents/blood thinners the week before surgery.  Her main concern is timing as she takes care of her great granddaughter during the week.    Signed by Stark Klein, MD

## 2018-05-11 ENCOUNTER — Ambulatory Visit (HOSPITAL_COMMUNITY): Payer: Medicare Other | Admitting: Anesthesiology

## 2018-05-11 ENCOUNTER — Ambulatory Visit (HOSPITAL_COMMUNITY)
Admission: RE | Admit: 2018-05-11 | Discharge: 2018-05-11 | Disposition: A | Discharge status: Home / Self Care | Payer: Medicare Other | Source: Ambulatory Visit | Attending: General Surgery | Admitting: General Surgery

## 2018-05-11 ENCOUNTER — Encounter (HOSPITAL_COMMUNITY): Payer: Self-pay | Admitting: Anesthesiology

## 2018-05-11 ENCOUNTER — Encounter (HOSPITAL_COMMUNITY)
Admission: RE | Disposition: A | Discharge status: Home / Self Care | Payer: Self-pay | Source: Ambulatory Visit | Attending: General Surgery

## 2018-05-11 DIAGNOSIS — D0512 Intraductal carcinoma in situ of left breast: Secondary | ICD-10-CM | POA: Insufficient documentation

## 2018-05-11 DIAGNOSIS — Z803 Family history of malignant neoplasm of breast: Secondary | ICD-10-CM | POA: Insufficient documentation

## 2018-05-11 DIAGNOSIS — Z171 Estrogen receptor negative status [ER-]: Secondary | ICD-10-CM | POA: Insufficient documentation

## 2018-05-11 DIAGNOSIS — E7800 Pure hypercholesterolemia, unspecified: Secondary | ICD-10-CM | POA: Insufficient documentation

## 2018-05-11 DIAGNOSIS — Z79899 Other long term (current) drug therapy: Secondary | ICD-10-CM | POA: Insufficient documentation

## 2018-05-11 DIAGNOSIS — I10 Essential (primary) hypertension: Secondary | ICD-10-CM | POA: Insufficient documentation

## 2018-05-11 DIAGNOSIS — Z7982 Long term (current) use of aspirin: Secondary | ICD-10-CM | POA: Insufficient documentation

## 2018-05-11 HISTORY — PX: BREAST LUMPECTOMY WITH RADIOACTIVE SEED LOCALIZATION: SHX6424

## 2018-05-11 SURGERY — BREAST LUMPECTOMY WITH RADIOACTIVE SEED LOCALIZATION
Anesthesia: General | Site: Breast | Laterality: Left

## 2018-05-11 MED ORDER — LACTATED RINGERS IV SOLN
INTRAVENOUS | Status: DC | PRN
  Administered 2018-05-11: 07:00:00 via INTRAVENOUS

## 2018-05-11 MED ORDER — GABAPENTIN 300 MG PO CAPS
300.0000 mg | ORAL_CAPSULE | Freq: Once | ORAL | Status: AC
  Administered 2018-05-11: 300 mg via ORAL

## 2018-05-11 MED ORDER — LIDOCAINE 2% (20 MG/ML) 5 ML SYRINGE
INTRAMUSCULAR | Status: DC | PRN
  Administered 2018-05-11: 60 mg via INTRAVENOUS

## 2018-05-11 MED ORDER — EPHEDRINE SULFATE-NACL 50-0.9 MG/10ML-% IV SOSY
PREFILLED_SYRINGE | INTRAVENOUS | Status: DC | PRN
  Administered 2018-05-11 (×4): 10 mg via INTRAVENOUS

## 2018-05-11 MED ORDER — LIDOCAINE HCL 1 % IJ SOLN
INTRAMUSCULAR | Status: DC | PRN
  Administered 2018-05-11: 20 mL via INTRAMUSCULAR

## 2018-05-11 MED ORDER — FENTANYL CITRATE (PF) 100 MCG/2ML IJ SOLN: 25.0000 ug | INTRAMUSCULAR | Status: DC | PRN

## 2018-05-11 MED ORDER — OXYCODONE HCL 5 MG PO TABS: 2.5000 mg | ORAL_TABLET | Freq: Four times a day (QID) | ORAL | 0 refills | Status: DC | PRN

## 2018-05-11 MED ORDER — CEFAZOLIN SODIUM-DEXTROSE 2-4 GM/100ML-% IV SOLN
2.0000 g | INTRAVENOUS | Status: AC
  Administered 2018-05-11: 2 g via INTRAVENOUS

## 2018-05-11 MED ORDER — PROMETHAZINE HCL 25 MG/ML IJ SOLN: 6.2500 mg | INTRAMUSCULAR | Status: DC | PRN

## 2018-05-11 MED ORDER — DEXAMETHASONE SODIUM PHOSPHATE 4 MG/ML IJ SOLN
INTRAMUSCULAR | Status: DC | PRN
  Administered 2018-05-11: 5 mg via INTRAVENOUS

## 2018-05-11 MED ORDER — FENTANYL CITRATE (PF) 250 MCG/5ML IJ SOLN
INTRAMUSCULAR | Status: AC
  Filled 2018-05-11: qty 5

## 2018-05-11 MED ORDER — 0.9 % SODIUM CHLORIDE (POUR BTL) OPTIME
TOPICAL | Status: DC | PRN
  Administered 2018-05-11: 1000 mL

## 2018-05-11 MED ORDER — PROPOFOL 10 MG/ML IV BOLUS
INTRAVENOUS | Status: DC | PRN
  Administered 2018-05-11: 120 mg via INTRAVENOUS

## 2018-05-11 MED ORDER — FENTANYL CITRATE (PF) 100 MCG/2ML IJ SOLN
INTRAMUSCULAR | Status: DC | PRN
  Administered 2018-05-11: 50 ug via INTRAVENOUS
  Administered 2018-05-11: 25 ug via INTRAVENOUS

## 2018-05-11 MED ORDER — BUPIVACAINE-EPINEPHRINE (PF) 0.25% -1:200000 IJ SOLN
INTRAMUSCULAR | Status: AC
  Filled 2018-05-11: qty 30

## 2018-05-11 MED ORDER — CHLORHEXIDINE GLUCONATE CLOTH 2 % EX PADS: 6.0000 | MEDICATED_PAD | Freq: Once | CUTANEOUS | Status: DC

## 2018-05-11 MED ORDER — ACETAMINOPHEN 500 MG PO TABS
1000.0000 mg | ORAL_TABLET | Freq: Once | ORAL | Status: AC
  Administered 2018-05-11: 1000 mg via ORAL

## 2018-05-11 MED ORDER — ONDANSETRON HCL 4 MG/2ML IJ SOLN
INTRAMUSCULAR | Status: DC | PRN
  Administered 2018-05-11: 4 mg via INTRAVENOUS

## 2018-05-11 MED ORDER — LIDOCAINE HCL 1 % IJ SOLN
INTRAMUSCULAR | Status: AC
  Filled 2018-05-11: qty 20

## 2018-05-11 MED ORDER — LIDOCAINE HCL (PF) 1 % IJ SOLN
INTRAMUSCULAR | Status: AC
  Filled 2018-05-11: qty 30

## 2018-05-11 SURGICAL SUPPLY — 52 items
ADH SKN CLS APL DERMABOND .7 (GAUZE/BANDAGES/DRESSINGS) ×1
BINDER BREAST LRG (GAUZE/BANDAGES/DRESSINGS) ×4 IMPLANT
BINDER BREAST XLRG (GAUZE/BANDAGES/DRESSINGS) IMPLANT
BLADE SURG 10 STRL SS (BLADE) ×3 IMPLANT
CANISTER SUCT 3000ML PPV (MISCELLANEOUS) IMPLANT
CHLORAPREP W/TINT 26ML (MISCELLANEOUS) ×3 IMPLANT
CLIP VESOCCLUDE LG 6/CT (CLIP) ×3 IMPLANT
CLIP VESOCCLUDE MED 6/CT (CLIP) ×3 IMPLANT
CLIP VESOCCLUDE SM WIDE 6/CT (CLIP) ×2 IMPLANT
CLOSURE WOUND 1/2 X4 (GAUZE/BANDAGES/DRESSINGS) ×1
COVER PROBE W GEL 5X96 (DRAPES) ×3 IMPLANT
COVER SURGICAL LIGHT HANDLE (MISCELLANEOUS) ×3 IMPLANT
DERMABOND ADVANCED (GAUZE/BANDAGES/DRESSINGS) ×2
DERMABOND ADVANCED .7 DNX12 (GAUZE/BANDAGES/DRESSINGS) ×1 IMPLANT
DEVICE DUBIN SPECIMEN MAMMOGRA (MISCELLANEOUS) ×3 IMPLANT
DRAPE CHEST BREAST 15X10 FENES (DRAPES) ×3 IMPLANT
DRAPE UTILITY XL STRL (DRAPES) ×3 IMPLANT
DRSG PAD ABDOMINAL 8X10 ST (GAUZE/BANDAGES/DRESSINGS) ×3 IMPLANT
ELECT CAUTERY BLADE 6.4 (BLADE) ×3 IMPLANT
ELECT REM PT RETURN 9FT ADLT (ELECTROSURGICAL) ×3
ELECTRODE REM PT RTRN 9FT ADLT (ELECTROSURGICAL) ×1 IMPLANT
GAUZE SPONGE 4X4 12PLY STRL (GAUZE/BANDAGES/DRESSINGS) ×2 IMPLANT
GAUZE SPONGE 4X4 12PLY STRL LF (GAUZE/BANDAGES/DRESSINGS) ×3 IMPLANT
GLOVE BIO SURGEON STRL SZ 6 (GLOVE) ×3 IMPLANT
GLOVE INDICATOR 6.5 STRL GRN (GLOVE) ×3 IMPLANT
GOWN STRL REUS W/ TWL LRG LVL3 (GOWN DISPOSABLE) ×1 IMPLANT
GOWN STRL REUS W/TWL 2XL LVL3 (GOWN DISPOSABLE) ×3 IMPLANT
GOWN STRL REUS W/TWL LRG LVL3 (GOWN DISPOSABLE) ×3
KIT BASIN OR (CUSTOM PROCEDURE TRAY) ×3 IMPLANT
KIT MARKER MARGIN INK (KITS) ×3 IMPLANT
LIGHT WAVEGUIDE WIDE FLAT (MISCELLANEOUS) ×2 IMPLANT
NDL HYPO 25GX1X1/2 BEV (NEEDLE) ×1 IMPLANT
NEEDLE HYPO 25GX1X1/2 BEV (NEEDLE) ×3 IMPLANT
NS IRRIG 1000ML POUR BTL (IV SOLUTION) ×2 IMPLANT
PACK SURGICAL SETUP 50X90 (CUSTOM PROCEDURE TRAY) ×3 IMPLANT
PAD ABD 8X10 STRL (GAUZE/BANDAGES/DRESSINGS) ×2 IMPLANT
PENCIL BUTTON HOLSTER BLD 10FT (ELECTRODE) ×3 IMPLANT
SPONGE LAP 18X18 X RAY DECT (DISPOSABLE) ×3 IMPLANT
STRIP CLOSURE SKIN 1/2X4 (GAUZE/BANDAGES/DRESSINGS) ×2 IMPLANT
SUT MNCRL AB 4-0 PS2 18 (SUTURE) ×3 IMPLANT
SUT SILK 2 0 SH (SUTURE) IMPLANT
SUT VIC AB 2-0 SH 27 (SUTURE) ×3
SUT VIC AB 2-0 SH 27XBRD (SUTURE) ×1 IMPLANT
SUT VIC AB 3-0 SH 27 (SUTURE) ×3
SUT VIC AB 3-0 SH 27X BRD (SUTURE) ×1 IMPLANT
SYR BULB 3OZ (MISCELLANEOUS) ×3 IMPLANT
SYR CONTROL 10ML LL (SYRINGE) ×3 IMPLANT
TOWEL OR 17X24 6PK STRL BLUE (TOWEL DISPOSABLE) ×3 IMPLANT
TOWEL OR 17X26 10 PK STRL BLUE (TOWEL DISPOSABLE) ×3 IMPLANT
TUBE CONNECTING 12'X1/4 (SUCTIONS) ×1
TUBE CONNECTING 12X1/4 (SUCTIONS) ×1 IMPLANT
YANKAUER SUCT BULB TIP NO VENT (SUCTIONS) ×2 IMPLANT

## 2018-05-11 NOTE — Op Note (Addendum)
Left Breast Radioactive seed localized lumpectomy  Indications: This patient presents with history of left breast cancer, cTis, upper outer quadrant  Pre-operative Diagnosis: left breast cancer  Post-operative Diagnosis: left breast cancer  Surgeon: Stark Klein   Anesthesia: General endotracheal anesthesia  ASA Class: 3  Procedure Details  The patient was seen in the Holding Room. The risks, benefits, complications, treatment options, and expected outcomes were discussed with the patient. The possibilities of bleeding, infection, the need for additional procedures, failure to diagnose a condition, and creating a complication requiring transfusion or operation were discussed with the patient. The patient concurred with the proposed plan, giving informed consent.  The site of surgery properly noted/marked. The patient was taken to Operating Room # 1, identified, and the procedure verified as Left Breast Seed localized lumpectomy. A Time Out was held and the above information confirmed.  The left arm, breast, and chest were prepped and draped in standard fashion. The lumpectomy was performed by creating an transverse incision over the lateral breast over the previously placed radioactive seed.  Dissection was carried down to around the point of maximum signal intensity. The cautery was used to perform the dissection.  Hemostasis was achieved with cautery. The edges of the cavity were marked with large clips, with one each medial, lateral, inferior and superior, and two clips posteriorly.   The specimen was inked with the margin marker paint kit.    Specimen radiography confirmed inclusion of the mammographic lesion, the clip, and the seed.  The background signal in the breast was zero.  The wound was irrigated and closed with 3-0 vicryl in layers and 4-0 monocryl subcuticular suture.      Sterile dressings were applied. At the end of the operation, all sponge, instrument, and needle counts were  correct.  Findings: grossly clear surgical margins and no adenopathy.  Posterior margin is pectoralis.    Estimated Blood Loss:  min         Specimens: left breast lumpectomy with seed.         Complications:  None; patient tolerated the procedure well.         Disposition: PACU - hemodynamically stable.         Condition: stable

## 2018-05-11 NOTE — Interval H&P Note (Signed)
History and Physical Interval Note:  05/11/2018 7:37 AM  Cynthia Peterson  has presented today for surgery, with the diagnosis of LEFT BREAST CANCER  The various methods of treatment have been discussed with the patient and family. After consideration of risks, benefits and other options for treatment, the patient has consented to  Procedure(s): BREAST LUMPECTOMY WITH RADIOACTIVE SEED LOCALIZATION (Left) as a surgical intervention .  The patient's history has been reviewed, patient examined, no change in status, stable for surgery.  I have reviewed the patient's chart and labs.  Questions were answered to the patient's satisfaction.     Stark Klein

## 2018-05-11 NOTE — Discharge Instructions (Addendum)
[facility] °Office Phone Number [phone] ° °BREAST BIOPSY/ PARTIAL MASTECTOMY: POST OP INSTRUCTIONS ° °Always review your discharge instruction sheet given to you by the facility where your surgery was performed. ° °IF YOU HAVE DISABILITY OR FAMILY LEAVE FORMS, YOU MUST BRING THEM TO THE OFFICE FOR PROCESSING.  DO NOT GIVE THEM TO YOUR DOCTOR. ° °1. A prescription for pain medication may be given to you upon discharge.  Take your pain medication as prescribed, if needed.  If narcotic pain medicine is not needed, then you may take acetaminophen (Tylenol) or ibuprofen (Advil) as needed. °2. Take your usually prescribed medications unless otherwise directed °3. If you need a refill on your pain medication, please contact your pharmacy.  They will contact our office to request authorization.  Prescriptions will not be filled after 5pm or on week-ends. °4. You should eat very light the first 24 hours after surgery, such as soup, crackers, pudding, etc.  Resume your normal diet the day after surgery. °5. Most patients will experience some swelling and bruising in the breast.  Ice packs and a good support bra will help.  Swelling and bruising can take several days to resolve.  °6. It is common to experience some constipation if taking pain medication after surgery.  Increasing fluid intake and taking a stool softener will usually help or prevent this problem from occurring.  A mild laxative (Milk of Magnesia or Miralax) should be taken according to package directions if there are no bowel movements after 48 hours. °7. Unless discharge instructions indicate otherwise, you may remove your bandages 48 hours after surgery, and you may shower at that time.  You may have steri-strips (small skin tapes) in place directly over the incision.  These strips should be left on the skin for 7-10 days.   Any sutures or staples will be removed at the office during your follow-up visit. °8. ACTIVITIES:  You may resume  regular daily activities (gradually increasing) beginning the next day.  Wearing a good support bra or sports bra (or the breast binder) minimizes pain and swelling.  You may have sexual intercourse when it is comfortable. °a. You may drive when you no longer are taking prescription pain medication, you can comfortably wear a seatbelt, and you can safely maneuver your car and apply brakes. °b. RETURN TO WORK:  __________1 week_______________ °9. You should see your doctor in the office for a follow-up appointment approximately two weeks after your surgery.  Your doctor’s nurse will typically make your follow-up appointment when she calls you with your pathology report.  Expect your pathology report 2-3 business days after your surgery.  You may call to check if you do not hear from us after three days. ° ° °WHEN TO CALL YOUR DOCTOR: °1. Fever over 101.0 °2. Nausea and/or vomiting. °3. Extreme swelling or bruising. °4. Continued bleeding from incision. °5. Increased pain, redness, or drainage from the incision. ° °The clinic staff is available to answer your questions during regular business hours.  Please don’t hesitate to call and ask to speak to one of the nurses for clinical concerns.  If you have a medical emergency, go to the nearest emergency room or call 911.  A surgeon from [facility] is always on call at the hospital. ° °For further questions, please visit [email or web address]  ° °

## 2018-05-11 NOTE — Anesthesia Postprocedure Evaluation (Signed)
Anesthesia Post Note  Patient: Cynthia Peterson  Procedure(s) Performed: BREAST LUMPECTOMY WITH RADIOACTIVE SEED LOCALIZATION (Left Breast)     Patient location during evaluation: PACU Anesthesia Type: General Level of consciousness: sedated Pain management: pain level controlled Vital Signs Assessment: post-procedure vital signs reviewed and stable Respiratory status: spontaneous breathing and respiratory function stable Cardiovascular status: stable Postop Assessment: no apparent nausea or vomiting Anesthetic complications: no    Last Vitals:  Vitals:   05/11/18 1000 05/11/18 1010  BP: (!) 142/71 (!) 144/71  Pulse: 90 96  Resp: 13 14  Temp:    SpO2: 96% 96%    Last Pain:  Vitals:   05/11/18 1010  TempSrc:   PainSc: 0-No pain                 Trine Fread DANIEL

## 2018-05-11 NOTE — Anesthesia Procedure Notes (Signed)
Procedure Name: LMA Insertion Date/Time: 05/11/2018 7:50 AM Performed by: Lieutenant Diego, CRNA Pre-anesthesia Checklist: Patient identified, Emergency Drugs available, Suction available and Patient being monitored Patient Re-evaluated:Patient Re-evaluated prior to induction Oxygen Delivery Method: Circle system utilized Preoxygenation: Pre-oxygenation with 100% oxygen Induction Type: IV induction Ventilation: Mask ventilation without difficulty LMA: LMA inserted LMA Size: 4.0 Number of attempts: 1 Placement Confirmation: positive ETCO2 and breath sounds checked- equal and bilateral Tube secured with: Tape Dental Injury: Teeth and Oropharynx as per pre-operative assessment

## 2018-05-11 NOTE — Transfer of Care (Signed)
Immediate Anesthesia Transfer of Care Note  Patient: Cynthia Peterson  Procedure(s) Performed: BREAST LUMPECTOMY WITH RADIOACTIVE SEED LOCALIZATION (Left Breast)  Patient Location: PACU  Anesthesia Type:General  Level of Consciousness: awake  Airway & Oxygen Therapy: Patient Spontanous Breathing and Patient connected to face mask oxygen  Post-op Assessment: Report given to RN and Post -op Vital signs reviewed and stable  Post vital signs: Reviewed and stable  Last Vitals:  Vitals Value Taken Time  BP 147/66 05/11/2018  8:57 AM  Temp    Pulse 84 05/11/2018  9:00 AM  Resp 18 05/11/2018  9:00 AM  SpO2 100 % 05/11/2018  9:00 AM  Vitals shown include unvalidated device data.  Last Pain:  Vitals:   05/11/18 0641  TempSrc:   PainSc: 0-No pain         Complications: No apparent anesthesia complications

## 2018-05-12 ENCOUNTER — Encounter (HOSPITAL_COMMUNITY): Payer: Self-pay | Admitting: General Surgery

## 2018-05-18 NOTE — Progress Notes (Signed)
Please let patient know margins are negative and no invasive cancer is seen.  One of the margins is close and we will review at conference to determine reexcision recommendation.

## 2018-05-25 ENCOUNTER — Ambulatory Visit: Payer: Medicare Other | Admitting: Hematology and Oncology

## 2018-05-25 NOTE — Progress Notes (Signed)
Location of Breast Cancer:Ductal carcinoma in situ (DCIS) of left UOQ at 3 o'clock   Histology per Pathology Report:   FINAL DIAGNOSIS Diagnosis 03-24-18 Breast, left, needle core biopsy, calcs @ 2 o'clock - 2:30 o'clock post depth - DUCTAL CARCINOMA IN SITU WITH CALCIFICATIONS. - SEE COMMENT. Microscopic Comment The carcinoma is high grade. There are a few foci suspicious for stromal invasion. Estrogen receptor and progesterone receptor studies will be performed and the results reported separately. The results were called to [facility] on 03/25/2018. (JBK:ah 03/25/18) Enid Cutter MD Pathologist, Electronic Signature (Case signed 03/25/2018)  FLUORESCENCE IN-SITU HYBRIDIZATION Results: HER2 - INSUFFICIENT: Not enough tumor cells for HER2 FISH Analysis. Thressa Sheller MD Pathologist, Electronic Signature ( Signed 03/29/2018)   Receptor Status: ER(0 % -), PR (0 % -), Her2-neu (10 %), Ki-(10 %) Tis NX stage 0   Did patient present with symptoms (if so, please note symptoms) or was this found on screening mammography?: Screening detected calcifications of the left breast  Past/Anticipated interventions by surgeon, if any: FINAL DIAGNOSIS Diagnosis 05-11-18 Dr. 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 - DUCTAL CARCINOMA IN SITU FOCALLY LESS THAN 1 MM FROM ANTERIOR AND INFERIOR MARGINS. - DUCTAL CARCINOMA IN SITU FOCALLY 1 MM FROM SUPERIOR MARGIN. - DUCTAL CARCINOMA IN SITU FOCALLY 2 MM FROM POSTERIOR AND MEDIAL MAGRINS. - PREVIOUS BIOPSY SITE AND CLIP. Microscopic Comment DCIS OF THE BREAST: Resection Procedure: Localization lumpectomy Specimen Laterality: Left breast  Receptor Status: ER(0 % -), PR (0 % -), Her2-neu (), Ki-67 (10 %)     Past/Anticipated interventions by medical oncology, if any:Dr. Lindi Adie   Chemotherapy No  Adjuvant radiation therapy No role of antiestrogen therapy because she is ER  PR negative.  Lymphedema issues, if any:  No ROM to left arm good. Skin to left breast healing well without signs of infection has a little  surgical glue on the incision line. Follow up visit to see Dr. Stark Klein  Monday,05-31-18.  Pain issues, if any:    SAFETY ISSUES:  Prior radiation? :No  Pacemaker/ICD? :No  Possible current pregnancy?:N/A  Is the patient on methotrexate? :No  Current Complaints / other details:Sister breast cancer, paternal uncle colon cancer  Right nostril lesion the patient will follow up with her dermatology appointment Friday,  05-28-18.  Wt Readings from Last 3 Encounters:  05/27/18 123 lb 12.8 oz (56.2 kg)  05/11/18 124 lb (56.2 kg)  05/07/18 124 lb 6.4 oz (56.4 kg)  BP (!) 157/87 (BP Location: Right Arm, Patient Position: Sitting)   Pulse 92   Temp 98.3 F (36.8 C) (Oral)   Resp 16   Ht 5' 4.5" (1.638 m)   Wt 123 lb 12.8 oz (56.2 kg)   SpO2 99%   BMI 20.92 kg/m  Georgena Spurling, RN 05/25/2018,4:36 PM

## 2018-05-27 ENCOUNTER — Other Ambulatory Visit: Payer: Self-pay

## 2018-05-27 ENCOUNTER — Ambulatory Visit
Admission: RE | Admit: 2018-05-27 | Discharge: 2018-05-27 | Disposition: A | Discharge status: Home / Self Care | Payer: Medicare Other | Source: Ambulatory Visit | Attending: Radiation Oncology | Admitting: Radiation Oncology

## 2018-05-27 ENCOUNTER — Inpatient Hospital Stay: Payer: Medicare Other | Attending: Hematology and Oncology | Admitting: Hematology and Oncology

## 2018-05-27 ENCOUNTER — Encounter: Payer: Self-pay | Admitting: *Deleted

## 2018-05-27 ENCOUNTER — Telehealth: Payer: Self-pay | Admitting: Hematology and Oncology

## 2018-05-27 ENCOUNTER — Encounter: Payer: Self-pay | Admitting: Radiation Oncology

## 2018-05-27 VITALS — BP 157/87 | HR 92 | Temp 98.3°F | Resp 16 | Ht 64.5 in | Wt 123.8 lb

## 2018-05-27 DIAGNOSIS — D0512 Intraductal carcinoma in situ of left breast: Secondary | ICD-10-CM | POA: Insufficient documentation

## 2018-05-27 DIAGNOSIS — I10 Essential (primary) hypertension: Secondary | ICD-10-CM | POA: Diagnosis not present

## 2018-05-27 DIAGNOSIS — Z510 Encounter for antineoplastic radiation therapy: Secondary | ICD-10-CM | POA: Insufficient documentation

## 2018-05-27 DIAGNOSIS — Z803 Family history of malignant neoplasm of breast: Secondary | ICD-10-CM | POA: Insufficient documentation

## 2018-05-27 DIAGNOSIS — Z882 Allergy status to sulfonamides status: Secondary | ICD-10-CM | POA: Insufficient documentation

## 2018-05-27 DIAGNOSIS — Z171 Estrogen receptor negative status [ER-]: Secondary | ICD-10-CM | POA: Diagnosis not present

## 2018-05-27 DIAGNOSIS — Z8673 Personal history of transient ischemic attack (TIA), and cerebral infarction without residual deficits: Secondary | ICD-10-CM | POA: Diagnosis not present

## 2018-05-27 DIAGNOSIS — Z79899 Other long term (current) drug therapy: Secondary | ICD-10-CM | POA: Insufficient documentation

## 2018-05-27 DIAGNOSIS — Z881 Allergy status to other antibiotic agents status: Secondary | ICD-10-CM | POA: Diagnosis not present

## 2018-05-27 DIAGNOSIS — Z7982 Long term (current) use of aspirin: Secondary | ICD-10-CM | POA: Diagnosis not present

## 2018-05-27 NOTE — Progress Notes (Signed)
Radiation Oncology         [phone] ________________________________  Name: Cynthia Peterson        MRN: [id number]  Date of Service: 05/27/2018 DOB: September 19, 1940  WU:[id number], Ronie Spies, MD  Nicholas Lose, MD     REFERRING PHYSICIAN: Nicholas Lose, MD   DIAGNOSIS: The encounter diagnosis was Ductal carcinoma in situ (DCIS) of left breast.   HISTORY OF PRESENT ILLNESS: Cynthia Peterson is a 77 y.o. female originally seen in the multidisciplinary breast clinic for a new diagnosis of left breast cancer. The patient was noted to have screening detected calcifications of the left breast. She proceeded to diagnostic imaging which revealed a span of calcifications at 3:00 in the left breast spanning 1.5 cm. Her axilla was negative for adenopathy. She underwent a biopsy on 03/24/18 that revealed a high grade DCIS with calcifications, ER/PR was negative, and there was question of microinvasion initially which is why a Ki 67 was performed and was 10%. Her case ultimately was signed off as non invasive disease. She underwent left lumpectomy on 05/11/18 which revealed high grade DCIS, ER/PR negative. Her DCIS was focally less than 1 mm from the anterior and inferior margins, and focally 1 mm from the superior margin. She is not anticipating any additional surgery. Given the ER/PR negative status and margins, she would be a good candidate for adjuvant radiotherapy. She comes to discuss this today.   PREVIOUS RADIATION THERAPY: No   PAST MEDICAL HISTORY:  Past Medical History:  Diagnosis Date  . Cancer [facility])    breast cancer, skin cancer removed to left side of nose  . Hypercholesteremia   . Hypertension   . Interstitial cystitis   . Osteoarthritis    knee  . Osteoporosis   . Stroke [facility])    at the age of 65       PAST SURGICAL HISTORY: Past Surgical History:  Procedure Laterality Date  . BREAST LUMPECTOMY WITH RADIOACTIVE SEED LOCALIZATION Left 05/11/2018   Procedure: BREAST  LUMPECTOMY WITH RADIOACTIVE SEED LOCALIZATION;  Surgeon: Stark Klein, MD;  Location: [facility];  Service: General;  Laterality: Left;  . carotid surgery    . TUBAL LIGATION       FAMILY HISTORY:  Family History  Problem Relation Age of Onset  . Colon cancer Paternal Uncle   . Breast cancer Sister 27     SOCIAL HISTORY:  reports that she has never smoked. She has never used smokeless tobacco. She reports that she drank alcohol. She reports that she does not use drugs. The patient is single and lives in [place]. She is retired and takes care of grandchildren. She has 3 daughters and 7 grand children.   ALLERGIES: Actonel [risedronate sodium]; Macrodantin [nitrofurantoin macrocrystal]; Robitet [tetracycline]; and Sulfa antibiotics   MEDICATIONS:  Current Outpatient Medications  Medication Sig Dispense Refill  . amLODipine (NORVASC) 5 MG tablet Take 5 mg by mouth at bedtime.     Marland Kitchen aspirin 325 MG tablet Take 325 mg by mouth daily with lunch.     . Calcium Carb-Cholecalciferol (CALCIUM 600+D) 600-800 MG-UNIT TABS Take 1 tablet by mouth 2 (two) times daily.    Marland Kitchen FLUoxetine (PROZAC) 20 MG capsule Take 20 mg by mouth every other day. In the afternoon with lunch    . ramipril (ALTACE) 10 MG capsule Take 10 mg by mouth daily with lunch.     . simvastatin (ZOCOR) 20 MG tablet Take 20 mg by mouth at bedtime.     Marland Kitchen  oxyCODONE (OXY IR/ROXICODONE) 5 MG immediate release tablet Take 0.5-1 tablets (2.5-5 mg total) by mouth every 6 (six) hours as needed for severe pain. (Patient not taking: Reported on 05/27/2018) 5 tablet 0   No current facility-administered medications for this encounter.      REVIEW OF SYSTEMS: On review of systems, the patient reports that she is doing well overall. She notes a crusting lesion that oozes periodically along her right nostril that she states has been oozing since January off and on. She is scheduled to see dermatology in October to assess this. She denies any  chest pain, shortness of breath, cough, fevers, chills, night sweats, unintended weight changes. She denies any bowel or bladder disturbances, and denies abdominal pain, nausea or vomiting. She denies any new musculoskeletal or joint aches or pains. A complete review of systems is obtained and is otherwise negative.     PHYSICAL EXAM:  Wt Readings from Last 3 Encounters:  05/27/18 123 lb 12.8 oz (56.2 kg)  05/11/18 124 lb (56.2 kg)  05/07/18 124 lb 6.4 oz (56.4 kg)   Temp Readings from Last 3 Encounters:  05/27/18 98.3 F (36.8 C) (Oral)  05/11/18 (!) 97.3 F (36.3 C)  05/07/18 98.9 F (37.2 C) (Oral)   BP Readings from Last 3 Encounters:  05/27/18 (!) 157/87  05/11/18 (!) 144/71  05/07/18 (!) 153/75   Pulse Readings from Last 3 Encounters:  05/27/18 92  05/11/18 96  05/07/18 98     In general this is a well appearing caucasian female in no acute distress. She is alert and oriented x4 and appropriate throughout the examination. HEENT reveals that the patient is normocephalic, atraumatic. EOMs are intact. She has a lesion along her right nostril. Cardiopulmonary assessment is negative for acute distress and she exhibits normal effort. The left breast incision site is intact without erythema. No fluctuance is noted.     ECOG = 0  0 - Asymptomatic (Fully active, able to carry on all predisease activities without restriction)  1 - Symptomatic but completely ambulatory (Restricted in physically strenuous activity but ambulatory and able to carry out work of a light or sedentary nature. For example, light housework, office work)  2 - Symptomatic, <50% in bed during the day (Ambulatory and capable of all self care but unable to carry out any work activities. Up and about more than 50% of waking hours)  3 - Symptomatic, >50% in bed, but not bedbound (Capable of only limited self-care, confined to bed or chair 50% or more of waking hours)  4 - Bedbound (Completely disabled. Cannot  carry on any self-care. Totally confined to bed or chair)  5 - Death   Eustace Pen MM, Creech RH, Tormey DC, et al. [phone]). "Toxicity and response criteria of the [facility]". [place] Oncol. 5 (6): 649-55    LABORATORY DATA:  Lab Results  Component Value Date   WBC 7.6 05/07/2018   HGB 13.6 05/07/2018   HCT 42.3 05/07/2018   MCV 95.7 05/07/2018   PLT 364 05/07/2018   Lab Results  Component Value Date   NA 141 05/07/2018   K 3.6 05/07/2018   CL 105 05/07/2018   CO2 26 05/07/2018   Lab Results  Component Value Date   ALT 15 03/31/2018   AST 17 03/31/2018   ALKPHOS 58 03/31/2018   BILITOT 0.6 03/31/2018      RADIOGRAPHY: No results found.     IMPRESSION/PLAN: 1. High grade, ER/PR negative DCIS  of the left breast. Dr. Lisbeth Renshaw reviews her pathology findings and reviews the nature of non invasive breast disease. She's healing well and ready to proceed with adjuvant radiotherapy. We discussed the risks, benefits, short, and long term effects of radiotherapy, and the patient is interested in proceeding. Dr. Lisbeth Renshaw discusses the delivery and logistics of radiotherapy and he recommends  a course of 4 weeks of radiotherapy. Written consent is obtained and placed in the chart, a copy was provided to the patient.  2. Right nostril lesion. The patient will follow up with her dermatology appointment she has scheduled to be seen for this on Monday.   In a visit lasting 25 minutes, greater than 50% of the time was spent face to face discussing her case, and coordinating the patient's care.  The above documentation reflects my direct findings during this shared patient visit. Please see the separate note by Dr. Lisbeth Renshaw on this date for the remainder of the patient's plan of care.    Carola Rhine, PAC

## 2018-05-27 NOTE — Progress Notes (Signed)
Location of Breast Cancer:Ductal carcinoma in situ (DCIS) of left UOQ at 3 o'clock   Histology per Pathology Report:   FINAL DIAGNOSIS Diagnosis 03-24-18 Breast, left, needle core biopsy, calcs @ 2 o'clock - 2:30 o'clock post depth - DUCTAL CARCINOMA IN SITU WITH CALCIFICATIONS. - SEE COMMENT. Microscopic Comment The carcinoma is high grade. There are a few foci suspicious for stromal invasion. Estrogen receptor and progesterone receptor studies will be performed and the results reported separately. The results were called to [facility] on 03/25/2018. (JBK:ah 03/25/18) Enid Cutter MD Pathologist, Electronic Signature (Case signed 03/25/2018)  FLUORESCENCE IN-SITU HYBRIDIZATION Results: HER2 - INSUFFICIENT: Not enough tumor cells for HER2 FISH Analysis. Thressa Sheller MD Pathologist, Electronic Signature ( Signed 03/29/2018)   Receptor Status: ER(0 % -), PR (0 % -), Her2-neu (10 %), Ki-(10 %) Tis NX stage 0   Did patient present with symptoms (if so, please note symptoms) or was this found on screening mammography?: Screening detected calcifications of the left breast  Past/Anticipated interventions by surgeon, if any: FINAL DIAGNOSIS Diagnosis 05-11-18 Dr. 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 - DUCTAL CARCINOMA IN SITU FOCALLY LESS THAN 1 MM FROM ANTERIOR AND INFERIOR MARGINS. - DUCTAL CARCINOMA IN SITU FOCALLY 1 MM FROM SUPERIOR MARGIN. - DUCTAL CARCINOMA IN SITU FOCALLY 2 MM FROM POSTERIOR AND MEDIAL MAGRINS. - PREVIOUS BIOPSY SITE AND CLIP. Microscopic Comment DCIS OF THE BREAST: Resection Procedure: Localization lumpectomy Specimen Laterality: Left breast  Receptor Status: ER(0 % -), PR (0 % -), Her2-neu (), Ki-67 (10 %)     Past/Anticipated interventions by medical oncology, if any:Dr. Lindi Adie to see this morning   Chemotherapy Not determined  Adjuvant radiation therapy No role of  antiestrogen therapy because she is ER PR negative.  Lymphedema issues, if any:  No ROM to left arm good. Skin to left breast healing well without signs of infection has a little  surgical glue on the incision line. Follow up visit to see Dr. Stark Klein  Monday,05-31-18.  Pain issues, if any:    SAFETY ISSUES:  Prior radiation? :No  Pacemaker/ICD? :No  Possible current pregnancy?:N/A  Is the patient on methotrexate? :No  Current Complaints / other details:Sister breast cancer, paternal uncle colon cancer  Right nostril lesion the patient will follow up with her dermatology appointment Friday,  05-28-18.  Wt Readings from Last 3 Encounters:  05/27/18 123 lb 12.8 oz (56.2 kg)  05/11/18 124 lb (56.2 kg)  05/07/18 124 lb 6.4 oz (56.4 kg)  There were no vitals taken for this visit. Georgena Spurling, RN 05/27/2018,8:27 AM

## 2018-05-27 NOTE — Telephone Encounter (Signed)
Gave patient avs and calendar.   °

## 2018-05-27 NOTE — Progress Notes (Signed)
Patient Care Team: Leeroy Cha, MD as PCP - General (Internal Medicine) Stark Klein, MD as Consulting Physician (General Surgery) Nicholas Lose, MD as Consulting Physician (Hematology and Oncology) Kyung Rudd, MD as Consulting Physician (Radiation Oncology)  DIAGNOSIS:  Encounter Diagnosis  Name Primary?  . Ductal carcinoma in situ (DCIS) of left breast     SUMMARY OF ONCOLOGIC HISTORY:   Ductal carcinoma in situ (DCIS) of left breast   03/24/2018 Initial Diagnosis    Screening detected left breast calcifications UOQ at 3 o'clock position pleomorphic in nature 1.5 cm, axilla negative, biopsy showed high-grade DCIS with calcifications ER 0%, PR 0%, Ki-67 10%, Tis NX stage 0    05/11/2018 Surgery    Left lumpectomy: High-grade DCIS with calcifications 15 mm, DCIS focally less than 1 mm from anterior and inferior margins and 1 mm from superior margin, 2 mm from posterior and medial margins, ER 0%, PR 0%, Tis NX stage 0    05/26/2018 Cancer Staging    Staging form: Breast, AJCC 8th Edition - Pathologic: Stage 0 (pTis (DCIS), pN0, cM0, ER-, PR-) - Signed by Gardenia Phlegm, NP on 05/26/2018     CHIEF COMPLIANT: Follow-up after left lumpectomy for DCIS  INTERVAL HISTORY: Cynthia Peterson is a 77 year old with above-mentioned history left lumpectomy done for high-grade DCIS who is here for a follow-up visit since her surgery.  She met with radiation oncology today.  We had discussed her case in the tumor board yesterday.  She is doing extremely well without any pain or discomfort.  REVIEW OF SYSTEMS:   Constitutional: Denies fevers, chills or abnormal weight loss Eyes: Denies blurriness of vision Ears, nose, mouth, throat, and face: Denies mucositis or sore throat Respiratory: Denies cough, dyspnea or wheezes Cardiovascular: Denies palpitation, chest discomfort Gastrointestinal:  Denies nausea, heartburn or change in bowel habits Skin: Denies abnormal skin  rashes Lymphatics: Denies new lymphadenopathy or easy bruising Neurological:Denies numbness, tingling or new weaknesses Behavioral/Psych: Mood is stable, no new changes  Extremities: No lower extremity edema Breast: Left lumpectomy All other systems were reviewed with the patient and are negative.  I have reviewed the past medical history, past surgical history, social history and family history with the patient and they are unchanged from previous note.  ALLERGIES:  is allergic to actonel [risedronate sodium]; macrodantin [nitrofurantoin macrocrystal]; robitet [tetracycline]; and sulfa antibiotics.  MEDICATIONS:  Current Outpatient Medications  Medication Sig Dispense Refill  . amLODipine (NORVASC) 5 MG tablet Take 5 mg by mouth at bedtime.     Marland Kitchen aspirin 325 MG tablet Take 325 mg by mouth daily with lunch.     . Calcium Carb-Cholecalciferol (CALCIUM 600+D) 600-800 MG-UNIT TABS Take 1 tablet by mouth 2 (two) times daily.    Marland Kitchen FLUoxetine (PROZAC) 20 MG capsule Take 20 mg by mouth every other day. In the afternoon with lunch    . oxyCODONE (OXY IR/ROXICODONE) 5 MG immediate release tablet Take 0.5-1 tablets (2.5-5 mg total) by mouth every 6 (six) hours as needed for severe pain. (Patient not taking: Reported on 05/27/2018) 5 tablet 0  . ramipril (ALTACE) 10 MG capsule Take 10 mg by mouth daily with lunch.     . simvastatin (ZOCOR) 20 MG tablet Take 20 mg by mouth at bedtime.      No current facility-administered medications for this visit.     PHYSICAL EXAMINATION: ECOG PERFORMANCE STATUS: 1 - Symptomatic but completely ambulatory  Vitals:   05/27/18 1022  BP: (!) 145/62  Pulse:  73  Resp: 17  Temp: 97.9 F (36.6 C)  SpO2: 100%   Filed Weights   05/27/18 1022  Weight: 123 lb 14.4 oz (56.2 kg)    GENERAL:alert, no distress and comfortable SKIN: skin color, texture, turgor are normal, no rashes or significant lesions EYES: normal, Conjunctiva are pink and non-injected, sclera  clear OROPHARYNX:no exudate, no erythema and lips, buccal mucosa, and tongue normal  NECK: supple, thyroid normal size, non-tender, without nodularity LYMPH:  no palpable lymphadenopathy in the cervical, axillary or inguinal LUNGS: clear to auscultation and percussion with normal breathing effort HEART: regular rate & rhythm and no murmurs and no lower extremity edema ABDOMEN:abdomen soft, non-tender and normal bowel sounds MUSCULOSKELETAL:no cyanosis of digits and no clubbing  NEURO: alert & oriented x 3 with fluent speech, no focal motor/sensory deficits EXTREMITIES: No lower extremity edema   LABORATORY DATA:  I have reviewed the data as listed CMP Latest Ref Rng & Units 05/07/2018 03/31/2018  Glucose 70 - 99 mg/dL 128(H) 127(H)  BUN 8 - 23 mg/dL 17 20  Creatinine 0.44 - 1.00 mg/dL 0.69 0.74  Sodium 135 - 145 mmol/L 141 141  Potassium 3.5 - 5.1 mmol/L 3.6 3.6  Chloride 98 - 111 mmol/L 105 106  CO2 22 - 32 mmol/L 26 24  Calcium 8.9 - 10.3 mg/dL 9.1 9.2  Total Protein 6.5 - 8.1 g/dL - 7.1  Total Bilirubin 0.3 - 1.2 mg/dL - 0.6  Alkaline Phos 38 - 126 U/L - 58  AST 15 - 41 U/L - 17  ALT 0 - 44 U/L - 15    Lab Results  Component Value Date   WBC 7.6 05/07/2018   HGB 13.6 05/07/2018   HCT 42.3 05/07/2018   MCV 95.7 05/07/2018   PLT 364 05/07/2018   NEUTROABS 4.6 03/31/2018    ASSESSMENT & PLAN:  Ductal carcinoma in situ (DCIS) of left breast 05/11/2018:Left lumpectomy: High-grade DCIS with calcifications 15 mm, DCIS focally less than 1 mm from anterior and inferior margins and 1 mm from superior margin, 2 mm from posterior and medial margins, ER 0%, PR 0%, Tis NX stage 0  Pathology counseling: I discussed the final pathology report of the patient provided  a copy of this report. I discussed the margins which were fairly close.  We also discussed the final staging along with previously performed ER/PR and   testing.  Plan: We presented her case in the tumor board and the  recommendation is to proceed with radiation. There is no role of antiestrogen therapy.  Return to clinic in 3 months for follow-up with survivorship care plan visit and after that she can be seen by our survivorship team.    No orders of the defined types were placed in this encounter.  The patient has a good understanding of the overall plan. she agrees with it. she will call with any problems that may develop before the next visit here.   Harriette Ohara, MD 05/27/18

## 2018-05-27 NOTE — Addendum Note (Signed)
Encounter addended by: Malena Edman, RN on: 05/27/2018 10:57 AM  Actions taken: Charge Capture section accepted

## 2018-05-27 NOTE — Assessment & Plan Note (Signed)
05/11/2018:Left lumpectomy: High-grade DCIS with calcifications 15 mm, DCIS focally less than 1 mm from anterior and inferior margins and 1 mm from superior margin, 2 mm from posterior and medial margins, ER 0%, PR 0%, Tis NX stage 0  Pathology counseling: I discussed the final pathology report of the patient provided  a copy of this report. I discussed the margins which were fairly close.  We also discussed the final staging along with previously performed ER/PR and   testing.  Plan: We presented her case in the tumor board and the recommendation is to proceed with radiation. There is no role of antiestrogen therapy.  Return to clinic in 3 months for follow-up with survivorship care plan visit and after that she can be seen by our survivorship team.

## 2018-05-28 DIAGNOSIS — Z510 Encounter for antineoplastic radiation therapy: Secondary | ICD-10-CM | POA: Diagnosis not present

## 2018-05-28 NOTE — Progress Notes (Signed)
  Radiation Oncology         [phone] ________________________________  Name: Cynthia Peterson MRN: [id number]  Date: 05/27/2018  DOB: 21-Jul-1941   DIAGNOSIS:     ICD-10-CM   1. Ductal carcinoma in situ (DCIS) of left breast D05.12     SIMULATION AND TREATMENT PLANNING NOTE  The patient presented for simulation prior to beginning her course of radiation treatment for her diagnosis of left-sided breast cancer. The patient was placed in a supine position on a breast board. A customized vac-lock bag was constructed and this complex treatment device will be used on a daily basis during her treatment. In this fashion, a CT scan was obtained through the chest area and an isocenter was placed near the chest wall within the breast.  The patient will be planned to receive a course of radiation initially to a dose of 42.56 Gy. This will consist of a whole breast radiotherapy technique. To accomplish this, 2 customized blocks have been designed which will correspond to medial and lateral whole breast tangent fields. This treatment will be accomplished at 2.66 Gy per fraction. A forward planning technique will also be evaluated to determine if this approach improves the plan. It is anticipated that the patient will then receive a 8 Gy boost to the seroma cavity which has been contoured. This will be accomplished at 2 Gy per fraction.   This initial treatment will consist of a 3-D conformal technique. The seroma has been contoured as the primary target structure. Additionally, dose volume histograms of both this target as well as the lungs and heart will also be evaluated. Such an approach is necessary to ensure that the target area is adequately covered while the nearby critical  normal structures are adequately spared.  Plan:  The final anticipated total dose therefore will correspond to 50.56 Gy.  Special treatment procedure was performed today due to the extra time and effort required by myself to  plan and prepare this patient for deep inspiration breath hold technique.  I have determined cardiac sparing to be of benefit to this patient to prevent long term cardiac damage due to radiation of the heart.  Bellows were placed on the patient's abdomen. To facilitate cardiac sparing, the patient was coached by the radiation therapists on breath hold techniques and breathing practice was performed. Practice waveforms were obtained. The patient was then scanned while maintaining breath hold in the treatment position.  This image was then transferred over to the imaging specialist. The imaging specialist then created a fusion of the free breathing and breath hold scans using the chest wall as the stable structure. I personally reviewed the fusion in axial, coronal and sagittal image planes.  Excellent cardiac sparing was obtained.  I felt the patient is an appropriate candidate for breath hold and the patient will be treated as such.  The image fusion was then reviewed with the patient to reinforce the necessity of reproducible breath hold.     _______________________________   Jodelle Gross, MD, PhD

## 2018-05-28 NOTE — Progress Notes (Signed)
  Radiation Oncology         [phone] ________________________________  Name: Cynthia Peterson MRN: [id number]  Date: 05/27/2018  DOB: 12-Mar-1941  Optical Surface Tracking Plan:  Since intensity modulated radiotherapy (IMRT) and 3D conformal radiation treatment methods are predicated on accurate and precise positioning for treatment, intrafraction motion monitoring is medically necessary to ensure accurate and safe treatment delivery.  The ability to quantify intrafraction motion without excessive ionizing radiation dose can only be performed with optical surface tracking. Accordingly, surface imaging offers the opportunity to obtain 3D measurements of patient position throughout IMRT and 3D treatments without excessive radiation exposure.  I am ordering optical surface tracking for this patient's upcoming course of radiotherapy. ________________________________  Kyung Rudd, MD 05/28/2018 7:59 AM    Reference:   Particia Jasper, et al. Surface imaging-based analysis of intrafraction motion for breast radiotherapy patients.Journal of [place], n. 6, nov. 2014. ISSN [id number].   Available at: <http://www.jacmp.org/index.php/jacmp/article/view/4957>.

## 2018-06-07 ENCOUNTER — Ambulatory Visit
Admission: RE | Admit: 2018-06-07 | Discharge: 2018-06-07 | Disposition: A | Discharge status: Home / Self Care | Payer: Medicare Other | Source: Ambulatory Visit | Attending: Radiation Oncology | Admitting: Radiation Oncology

## 2018-06-07 DIAGNOSIS — Z510 Encounter for antineoplastic radiation therapy: Secondary | ICD-10-CM | POA: Diagnosis not present

## 2018-06-07 DIAGNOSIS — D0512 Intraductal carcinoma in situ of left breast: Secondary | ICD-10-CM

## 2018-06-07 MED ORDER — RADIAPLEXRX EX GEL
Freq: Once | CUTANEOUS | Status: AC
  Administered 2018-06-07: 18:00:00 via TOPICAL

## 2018-06-07 MED ORDER — ALRA NON-METALLIC DEODORANT (RAD-ONC)
1.0000 "application " | Freq: Once | TOPICAL | Status: AC
  Administered 2018-06-07: 1 via TOPICAL

## 2018-06-07 NOTE — Progress Notes (Signed)

## 2018-06-08 ENCOUNTER — Ambulatory Visit
Admission: RE | Admit: 2018-06-08 | Discharge: 2018-06-08 | Disposition: A | Discharge status: Home / Self Care | Payer: Medicare Other | Source: Ambulatory Visit | Attending: Radiation Oncology | Admitting: Radiation Oncology

## 2018-06-08 DIAGNOSIS — Z510 Encounter for antineoplastic radiation therapy: Secondary | ICD-10-CM | POA: Diagnosis not present

## 2018-06-09 ENCOUNTER — Ambulatory Visit
Admission: RE | Admit: 2018-06-09 | Discharge: 2018-06-09 | Disposition: A | Discharge status: Home / Self Care | Payer: Medicare Other | Source: Ambulatory Visit | Attending: Radiation Oncology | Admitting: Radiation Oncology

## 2018-06-09 DIAGNOSIS — Z510 Encounter for antineoplastic radiation therapy: Secondary | ICD-10-CM | POA: Diagnosis not present

## 2018-06-10 ENCOUNTER — Ambulatory Visit
Admission: RE | Admit: 2018-06-10 | Discharge: 2018-06-10 | Disposition: A | Discharge status: Home / Self Care | Payer: Medicare Other | Source: Ambulatory Visit | Attending: Radiation Oncology | Admitting: Radiation Oncology

## 2018-06-10 DIAGNOSIS — Z510 Encounter for antineoplastic radiation therapy: Secondary | ICD-10-CM | POA: Diagnosis not present

## 2018-06-11 ENCOUNTER — Ambulatory Visit
Admission: RE | Admit: 2018-06-11 | Discharge: 2018-06-11 | Disposition: A | Discharge status: Home / Self Care | Payer: Medicare Other | Source: Ambulatory Visit | Attending: Radiation Oncology | Admitting: Radiation Oncology

## 2018-06-11 DIAGNOSIS — Z510 Encounter for antineoplastic radiation therapy: Secondary | ICD-10-CM | POA: Diagnosis not present

## 2018-06-14 ENCOUNTER — Ambulatory Visit
Admission: RE | Admit: 2018-06-14 | Discharge: 2018-06-14 | Disposition: A | Discharge status: Home / Self Care | Payer: Medicare Other | Source: Ambulatory Visit | Attending: Radiation Oncology | Admitting: Radiation Oncology

## 2018-06-14 DIAGNOSIS — Z510 Encounter for antineoplastic radiation therapy: Secondary | ICD-10-CM | POA: Diagnosis not present

## 2018-06-15 ENCOUNTER — Ambulatory Visit
Admission: RE | Admit: 2018-06-15 | Discharge: 2018-06-15 | Disposition: A | Discharge status: Home / Self Care | Payer: Medicare Other | Source: Ambulatory Visit | Attending: Radiation Oncology | Admitting: Radiation Oncology

## 2018-06-15 DIAGNOSIS — Z510 Encounter for antineoplastic radiation therapy: Secondary | ICD-10-CM | POA: Diagnosis not present

## 2018-06-16 ENCOUNTER — Ambulatory Visit
Admission: RE | Admit: 2018-06-16 | Discharge: 2018-06-16 | Disposition: A | Discharge status: Home / Self Care | Payer: Medicare Other | Source: Ambulatory Visit | Attending: Radiation Oncology | Admitting: Radiation Oncology

## 2018-06-16 DIAGNOSIS — Z510 Encounter for antineoplastic radiation therapy: Secondary | ICD-10-CM | POA: Diagnosis not present

## 2018-06-17 ENCOUNTER — Ambulatory Visit
Admission: RE | Admit: 2018-06-17 | Discharge: 2018-06-17 | Disposition: A | Discharge status: Home / Self Care | Payer: Medicare Other | Source: Ambulatory Visit | Attending: Radiation Oncology | Admitting: Radiation Oncology

## 2018-06-17 DIAGNOSIS — Z510 Encounter for antineoplastic radiation therapy: Secondary | ICD-10-CM | POA: Diagnosis not present

## 2018-06-18 ENCOUNTER — Ambulatory Visit
Admission: RE | Admit: 2018-06-18 | Discharge: 2018-06-18 | Disposition: A | Discharge status: Home / Self Care | Payer: Medicare Other | Source: Ambulatory Visit | Attending: Radiation Oncology | Admitting: Radiation Oncology

## 2018-06-18 DIAGNOSIS — Z510 Encounter for antineoplastic radiation therapy: Secondary | ICD-10-CM | POA: Diagnosis not present

## 2018-06-21 ENCOUNTER — Ambulatory Visit
Admission: RE | Admit: 2018-06-21 | Discharge: 2018-06-21 | Disposition: A | Discharge status: Home / Self Care | Payer: Medicare Other | Source: Ambulatory Visit | Attending: Radiation Oncology | Admitting: Radiation Oncology

## 2018-06-21 DIAGNOSIS — Z510 Encounter for antineoplastic radiation therapy: Secondary | ICD-10-CM | POA: Diagnosis not present

## 2018-06-22 ENCOUNTER — Ambulatory Visit
Admission: RE | Admit: 2018-06-22 | Discharge: 2018-06-22 | Disposition: A | Discharge status: Home / Self Care | Payer: Medicare Other | Source: Ambulatory Visit | Attending: Radiation Oncology | Admitting: Radiation Oncology

## 2018-06-22 DIAGNOSIS — Z510 Encounter for antineoplastic radiation therapy: Secondary | ICD-10-CM | POA: Diagnosis not present

## 2018-06-23 ENCOUNTER — Ambulatory Visit
Admission: RE | Admit: 2018-06-23 | Discharge: 2018-06-23 | Disposition: A | Discharge status: Home / Self Care | Payer: Medicare Other | Source: Ambulatory Visit | Attending: Radiation Oncology | Admitting: Radiation Oncology

## 2018-06-23 DIAGNOSIS — Z510 Encounter for antineoplastic radiation therapy: Secondary | ICD-10-CM | POA: Diagnosis not present

## 2018-06-24 ENCOUNTER — Ambulatory Visit
Admission: RE | Admit: 2018-06-24 | Discharge: 2018-06-24 | Disposition: A | Discharge status: Home / Self Care | Payer: Medicare Other | Source: Ambulatory Visit | Attending: Radiation Oncology | Admitting: Radiation Oncology

## 2018-06-24 DIAGNOSIS — Z510 Encounter for antineoplastic radiation therapy: Secondary | ICD-10-CM | POA: Diagnosis not present

## 2018-06-25 ENCOUNTER — Ambulatory Visit
Admission: RE | Admit: 2018-06-25 | Discharge: 2018-06-25 | Disposition: A | Discharge status: Home / Self Care | Payer: Medicare Other | Source: Ambulatory Visit | Attending: Radiation Oncology | Admitting: Radiation Oncology

## 2018-06-25 ENCOUNTER — Ambulatory Visit: Payer: Medicare Other | Admitting: Radiation Oncology

## 2018-06-25 DIAGNOSIS — D0512 Intraductal carcinoma in situ of left breast: Secondary | ICD-10-CM | POA: Diagnosis present

## 2018-06-25 DIAGNOSIS — Z510 Encounter for antineoplastic radiation therapy: Secondary | ICD-10-CM | POA: Diagnosis not present

## 2018-06-28 ENCOUNTER — Ambulatory Visit
Admission: RE | Admit: 2018-06-28 | Discharge: 2018-06-28 | Disposition: A | Discharge status: Home / Self Care | Payer: Medicare Other | Source: Ambulatory Visit | Attending: Radiation Oncology | Admitting: Radiation Oncology

## 2018-06-28 DIAGNOSIS — Z510 Encounter for antineoplastic radiation therapy: Secondary | ICD-10-CM | POA: Diagnosis not present

## 2018-06-29 ENCOUNTER — Ambulatory Visit
Admission: RE | Admit: 2018-06-29 | Discharge: 2018-06-29 | Disposition: A | Discharge status: Home / Self Care | Payer: Medicare Other | Source: Ambulatory Visit | Attending: Radiation Oncology | Admitting: Radiation Oncology

## 2018-06-29 DIAGNOSIS — Z510 Encounter for antineoplastic radiation therapy: Secondary | ICD-10-CM | POA: Diagnosis not present

## 2018-06-30 ENCOUNTER — Ambulatory Visit
Admission: RE | Admit: 2018-06-30 | Discharge: 2018-06-30 | Disposition: A | Discharge status: Home / Self Care | Payer: Medicare Other | Source: Ambulatory Visit | Attending: Radiation Oncology | Admitting: Radiation Oncology

## 2018-06-30 DIAGNOSIS — Z510 Encounter for antineoplastic radiation therapy: Secondary | ICD-10-CM | POA: Diagnosis not present

## 2018-07-01 ENCOUNTER — Ambulatory Visit
Admission: RE | Admit: 2018-07-01 | Discharge: 2018-07-01 | Disposition: A | Discharge status: Home / Self Care | Payer: Medicare Other | Source: Ambulatory Visit | Attending: Radiation Oncology | Admitting: Radiation Oncology

## 2018-07-01 DIAGNOSIS — Z510 Encounter for antineoplastic radiation therapy: Secondary | ICD-10-CM | POA: Diagnosis not present

## 2018-07-02 ENCOUNTER — Ambulatory Visit
Admission: RE | Admit: 2018-07-02 | Discharge: 2018-07-02 | Disposition: A | Discharge status: Home / Self Care | Payer: Medicare Other | Source: Ambulatory Visit | Attending: Radiation Oncology | Admitting: Radiation Oncology

## 2018-07-02 ENCOUNTER — Encounter: Payer: Self-pay | Admitting: Radiation Oncology

## 2018-07-02 DIAGNOSIS — Z510 Encounter for antineoplastic radiation therapy: Secondary | ICD-10-CM | POA: Diagnosis not present

## 2018-07-29 NOTE — Progress Notes (Signed)
  Radiation Oncology         [phone] ________________________________  Name: Cynthia Peterson MRN: [id number]  Date: 07/02/2018  DOB: 17-Apr-1941  End of Treatment Note  Diagnosis:   76 y.o. female with Stage [id number], High grade, ER/PR negative DCIS of the left breast    Indication for treatment:  Curative       Radiation treatment dates:   06/07/2018 - 07/02/2018  Site/dose:    1. Left Breast / 42.56 Gy in 16 fractions 2. Left Breast Boost / 10 Gy in 4 fractions  Beams/energy:   3D / 6X Photon  Narrative: The patient tolerated radiation treatment relatively well.  She experienced some expected skin irritation with mild erythema. Desquamation was not present at the end of treatment. She continues to use her Radiaplex cream.    Plan: The patient has completed radiation treatment. The patient will return to radiation oncology clinic for routine followup in one month. I advised them to call or return sooner if they have any questions or concerns related to their recovery or treatment.  ------------------------------------------------  Jodelle Gross, MD, PhD  This document serves as a record of services personally performed by Kyung Rudd, MD. It was created on his behalf by Rae Lips, a trained medical scribe. The creation of this record is based on the scribe's personal observations and the provider's statements to them. This document has been checked and approved by the attending provider.

## 2018-08-26 ENCOUNTER — Telehealth: Payer: Self-pay | Admitting: Hematology and Oncology

## 2018-08-26 NOTE — Telephone Encounter (Signed)
Left vm for pt with appointment updates. Sent out confirmation letter in the mail.

## 2018-08-31 ENCOUNTER — Other Ambulatory Visit: Payer: Self-pay

## 2018-08-31 ENCOUNTER — Encounter: Payer: Self-pay | Admitting: Radiation Oncology

## 2018-08-31 ENCOUNTER — Ambulatory Visit
Admission: RE | Admit: 2018-08-31 | Discharge: 2018-08-31 | Disposition: A | Discharge status: Home / Self Care | Payer: Medicare Other | Source: Ambulatory Visit | Attending: Radiation Oncology | Admitting: Radiation Oncology

## 2018-08-31 VITALS — BP 138/74 | HR 101 | Temp 98.6°F | Resp 20 | Wt 127.6 lb

## 2018-08-31 DIAGNOSIS — Z79899 Other long term (current) drug therapy: Secondary | ICD-10-CM | POA: Insufficient documentation

## 2018-08-31 DIAGNOSIS — Z86000 Personal history of in-situ neoplasm of breast: Secondary | ICD-10-CM | POA: Diagnosis not present

## 2018-08-31 DIAGNOSIS — Z7982 Long term (current) use of aspirin: Secondary | ICD-10-CM | POA: Diagnosis not present

## 2018-08-31 DIAGNOSIS — D0512 Intraductal carcinoma in situ of left breast: Secondary | ICD-10-CM

## 2018-08-31 DIAGNOSIS — Z923 Personal history of irradiation: Secondary | ICD-10-CM | POA: Diagnosis not present

## 2018-08-31 NOTE — Progress Notes (Signed)
  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 Left Breast / 42.56 Gy in 16 fractions 2. Left Breast Boost / 10 Gy in 4 fractions  Narrative:  The patient returns today for routine follow-up. During treatment she did very well with radiotherapy and did not have significant desquamation.                             On review of systems, the patient states she's doing very well. She denies any concerns with her skin at this time.  ALLERGIES:  is allergic to actonel [risedronate sodium]; macrodantin [nitrofurantoin macrocrystal]; robitet [tetracycline]; and sulfa antibiotics.  Meds: Current Outpatient Medications  Medication Sig Dispense Refill  . amLODipine (NORVASC) 5 MG tablet Take 5 mg by mouth at bedtime.     Marland Kitchen aspirin 325 MG tablet Take 325 mg by mouth daily with lunch.     . Calcium Carb-Cholecalciferol (CALCIUM 600+D) 600-800 MG-UNIT TABS Take 1 tablet by mouth 2 (two) times daily.    Marland Kitchen FLUoxetine (PROZAC) 20 MG capsule Take 20 mg by mouth every other day. In the afternoon with lunch    . ramipril (ALTACE) 10 MG capsule Take 10 mg by mouth daily with lunch.     . simvastatin (ZOCOR) 20 MG tablet Take 20 mg by mouth at bedtime.      No current facility-administered medications for this encounter.     Physical Findings:  weight is 127 lb 9.6 oz (57.9 kg). Her oral temperature is 98.6 F (37 C). Her blood pressure is 138/74 and her pulse is 101 (abnormal). Her respiration is 20 and oxygen saturation is 100%.  Pain Assessment Pain Score: 0-No pain/10 In general this is a well appearing caucasian female in no acute distress. She's alert and oriented  x4 and appropriate throughout the examination. Cardiopulmonary assessment is negative for acute distress and she exhibits normal effort. The left breast was examined and reveals no desquamation or hyperpigmentation.   Lab Findings: Lab Results  Component Value Date   WBC 7.6 05/07/2018   HGB 13.6 05/07/2018   HCT 42.3 05/07/2018   MCV 95.7 05/07/2018   PLT 364 05/07/2018     Radiographic Findings: No results found.  Impression/Plan: 1. 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 The patient has been doing well since completion of radiotherapy. We discussed that we would be happy to continue to follow her as needed, but she will also continue to follow up with Dr. Lindi Adie in medical oncology. She was counseled on skin care as well as measures to avoid sun exposure to this area.  2. Survivorship. We discussed the importance of survivorship evaluation and she is currently scheduled for this in the near future. She was also given the monthly calendar for access to resources offered within the cancer center.     Carola Rhine, PAC

## 2018-09-03 ENCOUNTER — Encounter: Payer: Medicare Other | Admitting: Adult Health

## 2018-09-22 ENCOUNTER — Telehealth: Payer: Self-pay

## 2018-09-22 NOTE — Telephone Encounter (Signed)
LVM for patient reminding of SCP visit with NP on 09/28/18 at 2 am.  Center number left for call back if needed.

## 2018-09-28 ENCOUNTER — Telehealth: Payer: Self-pay | Admitting: Adult Health

## 2018-09-28 ENCOUNTER — Encounter: Payer: Self-pay | Admitting: Adult Health

## 2018-09-28 ENCOUNTER — Inpatient Hospital Stay: Payer: Medicare Other | Attending: Adult Health | Admitting: Adult Health

## 2018-09-28 VITALS — BP 151/70 | HR 93 | Temp 98.5°F | Resp 17 | Ht 64.5 in | Wt 127.9 lb

## 2018-09-28 DIAGNOSIS — M810 Age-related osteoporosis without current pathological fracture: Secondary | ICD-10-CM | POA: Insufficient documentation

## 2018-09-28 DIAGNOSIS — D0512 Intraductal carcinoma in situ of left breast: Secondary | ICD-10-CM

## 2018-09-28 DIAGNOSIS — Z853 Personal history of malignant neoplasm of breast: Secondary | ICD-10-CM | POA: Diagnosis present

## 2018-09-28 NOTE — Telephone Encounter (Signed)
Gave avs and calendar ° °

## 2018-09-28 NOTE — Progress Notes (Signed)
CLINIC:  Survivorship   REASON FOR VISIT:  Routine follow-up post-treatment for a recent history of breast cancer.  BRIEF ONCOLOGIC HISTORY:    Ductal carcinoma in situ (DCIS) of left breast   03/24/2018 Initial Diagnosis    Screening detected left breast calcifications UOQ at 3 o'clock position pleomorphic in nature 1.5 cm, axilla negative, biopsy showed high-grade DCIS with calcifications ER 0%, PR 0%, Ki-67 10%, Tis NX stage 0    05/11/2018 Surgery    Left lumpectomy: High-grade DCIS with calcifications 15 mm, DCIS focally less than 1 mm from anterior and inferior margins and 1 mm from superior margin, 2 mm from posterior and medial margins, ER 0%, PR 0%, Tis NX stage 0    05/26/2018 Cancer Staging    Staging form: Breast, AJCC 8th Edition - Pathologic: Stage 0 (pTis (DCIS), pN0, cM0, ER-, PR-) - Signed by Gardenia Phlegm, NP on 05/26/2018    06/07/2018 - 07/02/2018 Radiation Therapy    1. Left Breast / 42.56 Gy in 16 fractions 2. Left Breast Boost / 10 Gy in 4 fractions     INTERVAL HISTORY:  Cynthia Peterson presents to the [facility] today for our initial meeting to review her survivorship care plan detailing her treatment course for breast cancer, as well as monitoring long-term side effects of that treatment, education regarding health maintenance, screening, and overall wellness and health promotion.     Overall, Cynthia Peterson reports feeling quite well.  She has remained active and lives alone.  Her daughter lives across the street.  She keeps her 60 year old granddaughter in the mornings.      REVIEW OF SYSTEMS:  Review of Systems  Constitutional: Negative for appetite change, chills, fatigue, fever and unexpected weight change.  HENT:   Negative for hearing loss, lump/mass, sore throat and trouble swallowing.   Eyes: Negative for eye problems and icterus.  Respiratory: Negative for chest tightness, cough and shortness of breath.   Cardiovascular: Negative for  chest pain, leg swelling and palpitations.  Gastrointestinal: Negative for abdominal distention, abdominal pain, constipation, diarrhea, nausea and vomiting.  Endocrine: Negative for hot flashes.  Genitourinary: Negative for difficulty urinating.   Musculoskeletal: Negative for arthralgias.  Skin: Negative for itching and rash.  Neurological: Negative for dizziness, extremity weakness and numbness.  Hematological: Negative for adenopathy.  Psychiatric/Behavioral: Negative for depression. The patient is not nervous/anxious.   Breast: Denies any new nodularity, masses, tenderness, nipple changes, or nipple discharge.      ONCOLOGY TREATMENT TEAM:  1. Surgeon:  Dr. Barry Dienes at [facility] 2. Medical Oncologist: Dr. Lindi Adie  3. Radiation Oncologist: Dr. Lisbeth Renshaw    PAST MEDICAL/SURGICAL HISTORY:  Past Medical History:  Diagnosis Date  . Cancer [facility])    breast cancer, skin cancer removed to left side of nose  . Hypercholesteremia   . Hypertension   . Interstitial cystitis   . Osteoarthritis    knee  . Osteoporosis   . Stroke [facility])    at the age of 54   Past Surgical History:  Procedure Laterality Date  . BREAST LUMPECTOMY WITH RADIOACTIVE SEED LOCALIZATION Left 05/11/2018   Procedure: BREAST LUMPECTOMY WITH RADIOACTIVE SEED LOCALIZATION;  Surgeon: Stark Klein, MD;  Location: [facility];  Service: General;  Laterality: Left;  . carotid surgery    . TUBAL LIGATION       ALLERGIES:  Allergies  Allergen Reactions  . Actonel [Risedronate Sodium] Other (See Comments)    Joint Pain   .  Macrodantin [Nitrofurantoin Macrocrystal] Rash  . Robitet [Tetracycline] Rash  . Sulfa Antibiotics Rash     CURRENT MEDICATIONS:  Outpatient Encounter Medications as of 09/28/2018  Medication Sig  . amLODipine (NORVASC) 5 MG tablet Take 5 mg by mouth at bedtime.   Marland Kitchen aspirin 325 MG tablet Take 325 mg by mouth daily with lunch.   . Calcium Carb-Cholecalciferol (CALCIUM 600+D) 600-800  MG-UNIT TABS Take 1 tablet by mouth 2 (two) times daily.  Marland Kitchen FLUoxetine (PROZAC) 20 MG capsule Take 20 mg by mouth every other day. In the afternoon with lunch  . ramipril (ALTACE) 10 MG capsule Take 10 mg by mouth daily with lunch.   . simvastatin (ZOCOR) 20 MG tablet Take 20 mg by mouth at bedtime.    No facility-administered encounter medications on file as of 09/28/2018.      ONCOLOGIC FAMILY HISTORY:  Family History  Problem Relation Age of Onset  . Colon cancer Paternal Uncle   . Breast cancer Sister 12     GENETIC COUNSELING/TESTING: Not at this time  SOCIAL HISTORY:  Social History   Socioeconomic History  . Marital status: Single    Spouse name: Not on file  . Number of children: Not on file  . Years of education: Not on file  . Highest education level: Not on file  Occupational History  . Not on file  Social Needs  . Financial resource strain: Not on file  . Food insecurity:    Worry: Not on file    Inability: Not on file  . Transportation needs:    Medical: No    Non-medical: No  Tobacco Use  . Smoking status: Never Smoker  . Smokeless tobacco: Never Used  Substance and Sexual Activity  . Alcohol use: Not Currently  . Drug use: Never  . Sexual activity: Not Currently  Lifestyle  . Physical activity:    Days per week: Not on file    Minutes per session: Not on file  . Stress: Not on file  Relationships  . Social connections:    Talks on phone: Not on file    Gets together: Not on file    Attends religious service: Not on file    Active member of club or organization: Not on file    Attends meetings of clubs or organizations: Not on file    Relationship status: Not on file  . Intimate partner violence:    Fear of current or ex partner: No    Emotionally abused: No    Physically abused: No    Forced sexual activity: No  Other Topics Concern  . Not on file  Social History Narrative  . Not on file      PHYSICAL EXAMINATION:  Vital Signs:     Vitals:   09/28/18 1104  BP: (!) 151/70  Pulse: 93  Resp: 17  Temp: 98.5 F (36.9 C)  SpO2: 98%   Filed Weights   09/28/18 1104  Weight: 127 lb 14.4 oz (58 kg)   General: Well-nourished, well-appearing female in no acute distress.  She is unaccompanied today.   HEENT: Head is normocephalic.  Pupils equal and reactive to light. Conjunctivae clear without exudate.  Sclerae anicteric. Oral mucosa is pink, moist.  Oropharynx is pink without lesions or erythema.  Lymph: No cervical, supraclavicular, or infraclavicular lymphadenopathy noted on palpation.  Cardiovascular: Regular rate and rhythm.Marland Kitchen Respiratory: Clear to auscultation bilaterally. Chest expansion symmetric; breathing non-labored.  Breasts: left breast s/p lumpectomy and radiation, no  sign of local recurrence, right breast benign GI: Abdomen soft and round; non-tender, non-distended. Bowel sounds normoactive.  GU: Deferred.  Neuro: No focal deficits. Steady gait.  Psych: Mood and affect normal and appropriate for situation.  Extremities: No edema. MSK: No focal spinal tenderness to palpation.  Full range of motion in bilateral upper extremities Skin: Warm and dry.  LABORATORY DATA:  None for this visit.  DIAGNOSTIC IMAGING:  None for this visit.      ASSESSMENT AND PLAN:  Ms.. Peterson is a pleasant 78 y.o. female with Stage 0 left breast DCIS, ER-/PR-, diagnosed in 02/2018, treated with lumpectomy and adjuvant radiation therapy.  She presents to the Survivorship Clinic for our initial meeting and routine follow-up post-completion of treatment for breast cancer.    1. Stage IA left breast cancer:  Cynthia Peterson is continuing to recover from definitive treatment for breast cancer. She will follow-up with Dr. Barry Dienes in 3 months, and me 6 months after that with history and physical exam per surveillance protocol.  Today, a comprehensive survivorship care plan and treatment summary was reviewed with the patient today detailing her  breast cancer diagnosis, treatment course, potential late/long-term effects of treatment, appropriate follow-up care with recommendations for the future, and patient education resources.  A copy of this summary, along with a letter will be sent to the patient's primary care provider via mail/fax/In Basket message after today's visit.    2. Bone health:  Given Cynthia Peterson's age/history of breast cancer, she is at risk for bone demineralization.  Her last DEXA scan was 10/2017 and was consistent with osteoporosis.  She is seeing her PCP for this and may start Prolia.  In the meantime, she was encouraged to increase her consumption of foods rich in calcium, as well as increase her weight-bearing activities.  She was given education on specific activities to promote bone health.  3. Cancer screening:  Due to Cynthia Peterson's history and her age, she should receive screening for skin cancers, colon cancer, and gynecologic cancers.  The information and recommendations are listed on the patient's comprehensive care plan/treatment summary and were reviewed in detail with the patient.    4. Health maintenance and wellness promotion: Cynthia Peterson was encouraged to consume 5-7 servings of fruits and vegetables per day. We reviewed the "Nutrition Rainbow" handout, as well as the handout "Take Control of Your Health and Reduce Your Cancer Risk" from the [employer].  She was also encouraged to engage in moderate to vigorous exercise for 30 minutes per day most days of the week. We discussed the [employer] fitness program, which is designed for cancer survivors to help them become more physically fit after cancer treatments.  She was instructed to limit her alcohol consumption and continue to abstain from tobacco use.     5. Support services/counseling: It is not uncommon for this period of the patient's cancer care trajectory to be one of many emotions and stressors.  We discussed an opportunity for her to  participate in the next session of [facility] ("Finding Your New Normal") support group series designed for patients after they have completed treatment.   Cynthia Peterson was encouraged to take advantage of our many other support services programs, support groups, and/or counseling in coping with her new life as a cancer survivor after completing anti-cancer treatment.  She was offered support today through active listening and expressive supportive counseling.  She was given information regarding our available services and encouraged to contact me  with any questions or for help enrolling in any of our support group/programs.    Dispo:   -Return to cancer center 06/2019 for LTS follow up  -Mammogram due in 02/2019 at [facility] -Follow up with Dr. Barry Dienes at [facility] 12/2018 -She is welcome to return back to the Survivorship Clinic at any time; no additional follow-up needed at this time.  -Consider referral back to survivorship as a long-term survivor for continued surveillance  A total of (30) minutes of face-to-face time was spent with this patient with greater than 50% of that time in counseling and care-coordination.   Gardenia Phlegm, NP Survivorship Program [facility] [phone]   Note: PRIMARY CARE PROVIDER Leeroy Cha, [place] [phone]

## 2019-02-02 ENCOUNTER — Other Ambulatory Visit: Payer: Self-pay | Admitting: General Surgery

## 2019-02-02 DIAGNOSIS — Z853 Personal history of malignant neoplasm of breast: Secondary | ICD-10-CM

## 2019-03-30 ENCOUNTER — Encounter: Payer: Self-pay | Admitting: Adult Health

## 2019-06-07 ENCOUNTER — Telehealth: Payer: Self-pay | Admitting: Adult Health

## 2019-06-07 NOTE — Telephone Encounter (Signed)
I left a message regarding reschedule moved from regular visit to [facility] appointment

## 2019-07-01 ENCOUNTER — Ambulatory Visit: Payer: Medicare Other | Admitting: Adult Health

## 2019-09-12 ENCOUNTER — Encounter: Payer: Self-pay | Admitting: Adult Health

## 2019-09-12 ENCOUNTER — Inpatient Hospital Stay: Payer: Medicare Other | Attending: Adult Health | Admitting: Adult Health

## 2019-09-12 ENCOUNTER — Other Ambulatory Visit: Payer: Self-pay

## 2019-09-12 VITALS — BP 142/81 | HR 121 | Resp 17 | Ht 64.5 in | Wt 132.1 lb

## 2019-09-12 DIAGNOSIS — N3010 Interstitial cystitis (chronic) without hematuria: Secondary | ICD-10-CM | POA: Insufficient documentation

## 2019-09-12 DIAGNOSIS — E7800 Pure hypercholesterolemia, unspecified: Secondary | ICD-10-CM | POA: Insufficient documentation

## 2019-09-12 DIAGNOSIS — Z853 Personal history of malignant neoplasm of breast: Secondary | ICD-10-CM | POA: Diagnosis present

## 2019-09-12 DIAGNOSIS — M1990 Unspecified osteoarthritis, unspecified site: Secondary | ICD-10-CM | POA: Insufficient documentation

## 2019-09-12 DIAGNOSIS — D0512 Intraductal carcinoma in situ of left breast: Secondary | ICD-10-CM

## 2019-09-12 DIAGNOSIS — Z7982 Long term (current) use of aspirin: Secondary | ICD-10-CM | POA: Diagnosis not present

## 2019-09-12 DIAGNOSIS — Z923 Personal history of irradiation: Secondary | ICD-10-CM | POA: Diagnosis not present

## 2019-09-12 DIAGNOSIS — I10 Essential (primary) hypertension: Secondary | ICD-10-CM | POA: Insufficient documentation

## 2019-09-12 DIAGNOSIS — M810 Age-related osteoporosis without current pathological fracture: Secondary | ICD-10-CM | POA: Insufficient documentation

## 2019-09-12 DIAGNOSIS — Z8673 Personal history of transient ischemic attack (TIA), and cerebral infarction without residual deficits: Secondary | ICD-10-CM | POA: Diagnosis not present

## 2019-09-12 DIAGNOSIS — Z79899 Other long term (current) drug therapy: Secondary | ICD-10-CM | POA: Diagnosis not present

## 2019-09-12 NOTE — Patient Instructions (Signed)

## 2019-09-12 NOTE — Progress Notes (Signed)
CLINIC:  Survivorship   REASON FOR VISIT:  Routine follow-up for history of breast cancer.   BRIEF ONCOLOGIC HISTORY:  Oncology History  Ductal carcinoma in situ (DCIS) of left breast  03/24/2018 Initial Diagnosis   Screening detected left breast calcifications UOQ at 3 o'clock position pleomorphic in nature 1.5 cm, axilla negative, biopsy showed high-grade DCIS with calcifications ER 0%, PR 0%, Ki-67 10%, Tis NX stage 0   05/11/2018 Surgery   Left lumpectomy: High-grade DCIS with calcifications 15 mm, DCIS focally less than 1 mm from anterior and inferior margins and 1 mm from superior margin, 2 mm from posterior and medial margins, ER 0%, PR 0%, Tis NX stage 0   05/26/2018 Cancer Staging   Staging form: Breast, AJCC 8th Edition - Pathologic: Stage 0 (pTis (DCIS), pN0, cM0, ER-, PR-) - Signed by Gardenia Phlegm, NP on 05/26/2018   06/07/2018 - 07/02/2018 Radiation Therapy   1. Left Breast / 42.56 Gy in 16 fractions 2. Left Breast Boost / 10 Gy in 4 fractions      INTERVAL HISTORY:  Cynthia Peterson presents to the [facility] today for routine follow-up for her history of breast cancer.  Overall, she reports feeling quite well.   Cynthia has osteoporosis and has tried two different oral bisphosphanates.  She was unable to tolerate either of these.  She does not want to receive Prolia.    Her most recent mammogram was on 03/30/2019 and showed no mammographic evidence of malignancy and breast density B.  She continues to see her PCP regularly.  She notes she is due for her physical soon.  She has graduated from colon cancer screening.  She has regular skin checks with Dr. Cleophus Molt.    Cynthia Peterson is exercising regularly by walking, and also keeps her grandchild who will be 5 in March.    REVIEW OF SYSTEMS:  Review of Systems  Constitutional: Negative for appetite change, chills, fatigue, fever and unexpected weight change.  HENT:   Negative for hearing loss, lump/mass, sore  throat and trouble swallowing.   Eyes: Negative for eye problems and icterus.  Respiratory: Negative for chest tightness, cough and shortness of breath.   Cardiovascular: Negative for chest pain, leg swelling and palpitations.  Gastrointestinal: Negative for abdominal distention, abdominal pain, constipation, diarrhea, nausea and vomiting.  Endocrine: Negative for hot flashes.  Genitourinary: Negative for difficulty urinating.   Musculoskeletal: Negative for arthralgias.  Skin: Negative for itching and rash.  Neurological: Negative for dizziness, extremity weakness and numbness.  Psychiatric/Behavioral: Negative for depression. The patient is not nervous/anxious.   Breast: Denies any new nodularity, masses, tenderness, nipple changes, or nipple discharge.       PAST MEDICAL/SURGICAL HISTORY:  Past Medical History:  Diagnosis Date  . Cancer [facility])    breast cancer, skin cancer removed to left side of nose  . Hypercholesteremia   . Hypertension   . Interstitial cystitis   . Osteoarthritis    knee  . Osteoporosis   . Stroke [facility])    at the age of 52   Past Surgical History:  Procedure Laterality Date  . BREAST LUMPECTOMY WITH RADIOACTIVE SEED LOCALIZATION Left 05/11/2018   Procedure: BREAST LUMPECTOMY WITH RADIOACTIVE SEED LOCALIZATION;  Surgeon: Stark Klein, MD;  Location: [facility];  Service: General;  Laterality: Left;  . carotid surgery    . TUBAL LIGATION       ALLERGIES:  Allergies  Allergen Reactions  . Actonel [Risedronate Sodium] Other (See Comments)  Joint Pain   . Macrodantin [Nitrofurantoin Macrocrystal] Rash  . Robitet [Tetracycline] Rash  . Sulfa Antibiotics Rash     CURRENT MEDICATIONS:  Outpatient Encounter Medications as of 09/12/2019  Medication Sig  . amLODipine (NORVASC) 5 MG tablet Take 5 mg by mouth at bedtime.   Marland Kitchen aspirin 325 MG tablet Take 325 mg by mouth daily with lunch.   . Calcium Carb-Cholecalciferol (CALCIUM 600+D) 600-800 MG-UNIT TABS  Take 1 tablet by mouth 2 (two) times daily.  Marland Kitchen FLUoxetine (PROZAC) 20 MG capsule Take 20 mg by mouth every other day. In the afternoon with lunch  . ramipril (ALTACE) 10 MG capsule Take 10 mg by mouth daily with lunch.   . simvastatin (ZOCOR) 20 MG tablet Take 20 mg by mouth at bedtime.    No facility-administered encounter medications on file as of 09/12/2019.     ONCOLOGIC FAMILY HISTORY:  Family History  Problem Relation Age of Onset  . Colon cancer Paternal Uncle   . Breast cancer Sister 108    GENETIC COUNSELING/TESTING: Not at this time  SOCIAL HISTORY:  Social History   Socioeconomic History  . Marital status: Single    Spouse name: Not on file  . Number of children: Not on file  . Years of education: Not on file  . Highest education level: Not on file  Occupational History  . Not on file  Tobacco Use  . Smoking status: Never Smoker  . Smokeless tobacco: Never Used  Substance and Sexual Activity  . Alcohol use: Not Currently  . Drug use: Never  . Sexual activity: Not Currently  Other Topics Concern  . Not on file  Social History Narrative  . Not on file   Social Determinants of Health   Financial Resource Strain:   . Difficulty of Paying Living Expenses: Not on file  Food Insecurity:   . Worried About Charity fundraiser in the Last Year: Not on file  . Ran Out of Food in the Last Year: Not on file  Transportation Needs:   . Lack of Transportation (Medical): Not on file  . Lack of Transportation (Non-Medical): Not on file  Physical Activity:   . Days of Exercise per Week: Not on file  . Minutes of Exercise per Session: Not on file  Stress:   . Feeling of Stress : Not on file  Social Connections:   . Frequency of Communication with Friends and Family: Not on file  . Frequency of Social Gatherings with Friends and Family: Not on file  . Attends Religious Services: Not on file  . Active Member of Clubs or Organizations: Not on file  . Attends Theatre manager Meetings: Not on file  . Marital Status: Not on file  Intimate Partner Violence:   . Fear of Current or Ex-Partner: Not on file  . Emotionally Abused: Not on file  . Physically Abused: Not on file  . Sexually Abused: Not on file      PHYSICAL EXAMINATION:  Vital Signs: Vitals:   09/12/19 1110  BP: (!) 142/81  Pulse: (!) 121  Resp: 17  SpO2: 99%   Filed Weights   09/12/19 1110  Weight: 132 lb 1.6 oz (59.9 kg)   General: Well-nourished, well-appearing female in no acute distress.  Unaccompanied today.   HEENT: Head is normocephalic.  Pupils equal and reactive to light. Conjunctivae clear without exudate.  Sclerae anicteric. Oral mucosa is pink, moist.  Oropharynx is pink without lesions or erythema.  Lymph: No cervical, supraclavicular, or infraclavicular lymphadenopathy noted on palpation.  Cardiovascular: Regular rate and rhythm. HR 96 on recheck. Respiratory: Clear to auscultation bilaterally. Chest expansion symmetric; breathing non-labored.  Breast Exam:  -Left breast: No appreciable masses on palpation. No skin redness, thickening, or peau d'orange appearance; no nipple retraction or nipple discharge; mild distortion in symmetry at previous lumpectomy site well healed scar without erythema or nodularity.  -Right breast: No appreciable masses on palpation. No skin redness, thickening, or peau d'orange appearance; no nipple retraction or nipple discharge; -Axilla: No axillary adenopathy bilaterally.  GI: Abdomen soft and round; non-tender, non-distended. Bowel sounds normoactive. No hepatosplenomegaly.   GU: Deferred.  Neuro: No focal deficits. Steady gait.  Psych: Mood and affect normal and appropriate for situation.  MSK: No focal spinal tenderness to palpation, full range of motion in bilateral upper extremities Extremities: No edema. Skin: Warm and dry.  LABORATORY DATA:  None for this visit   DIAGNOSTIC IMAGING:  Most recent mammogram: awaiting scan,  normal from 03/2019    ASSESSMENT AND PLAN:  Cynthia Peterson is a pleasant 79 y.o. female with history of Stage 0 left breast DCIS, ER-/PR-, diagnosed in 02/2018, treated with lumpectomy and adjuvant radiation therapy.  She [facility] for surveillance and routine follow-up.   1. History of breast cancer:  Cynthia Peterson is currently clinically and radiographically without evidence of disease or recurrence of breast cancer. She will be due for mammogram in 03/2020; orders placed today. She will return and see Dr. Barry Dienes in July, and I will see her in one year for LTS f/u.  I encouraged her to call me with any questions or concerns before her next visit at the cancer center, and I would be happy to see her sooner, if needed.    2. Bone health:  Given Cynthia Peterson's age, history of breast cancer, she is at risk for bone demineralization. Her last DEXA scan was in 10/2017 and showed osteoporosis with a T score of -3.8 in the left femur.  She cannot tolerate oral bisphosphanates and declines Prolia.  In the meantime, she was encouraged to increase her consumption of foods rich in calcium, as well as increase her weight-bearing activities.  She was given education on specific food and activities to promote bone health.  3. Cancer screening:  Due to Cynthia Peterson's history and her age, she should receive screening for skin cancers. She was encouraged to follow-up with her PCP for appropriate cancer screenings.   4. Health maintenance and wellness promotion: Cynthia Peterson was encouraged to consume 5-7 servings of fruits and vegetables per day. She was also encouraged to engage in moderate to vigorous exercise for 30 minutes per day most days of the week. She was instructed to limit her alcohol consumption and continue to abstain from tobacco use.    Dispo:  -Return to cancer center in one year for LTS -Mammogram 03/2020   A total of (20) minutes of face-to-face time was spent with this patient with  greater than 50% of that time in counseling and care-coordination.   Gardenia Phlegm, NP Survivorship Program [facility] [phone]   Note: PRIMARY CARE PROVIDER Leeroy Cha, [place] [phone]

## 2020-08-16 ENCOUNTER — Telehealth: Payer: Self-pay | Admitting: Adult Health

## 2020-08-16 NOTE — Telephone Encounter (Signed)
Rescheduled appointment due to provider day off. Patient is aware of changes. 

## 2020-08-16 NOTE — Telephone Encounter (Signed)
Left message to reschedule appointment due to provider day off. 

## 2020-09-12 DIAGNOSIS — K219 Gastro-esophageal reflux disease without esophagitis: Secondary | ICD-10-CM | POA: Diagnosis not present

## 2020-09-12 DIAGNOSIS — G43009 Migraine without aura, not intractable, without status migrainosus: Secondary | ICD-10-CM | POA: Diagnosis not present

## 2020-09-12 DIAGNOSIS — I10 Essential (primary) hypertension: Secondary | ICD-10-CM | POA: Diagnosis not present

## 2020-09-12 DIAGNOSIS — E785 Hyperlipidemia, unspecified: Secondary | ICD-10-CM | POA: Diagnosis not present

## 2020-09-12 DIAGNOSIS — C50212 Malignant neoplasm of upper-inner quadrant of left female breast: Secondary | ICD-10-CM | POA: Diagnosis not present

## 2020-09-12 DIAGNOSIS — M810 Age-related osteoporosis without current pathological fracture: Secondary | ICD-10-CM | POA: Diagnosis not present

## 2020-09-18 ENCOUNTER — Encounter: Payer: Medicare Other | Admitting: Adult Health

## 2020-09-18 DIAGNOSIS — L218 Other seborrheic dermatitis: Secondary | ICD-10-CM | POA: Diagnosis not present

## 2020-09-19 ENCOUNTER — Inpatient Hospital Stay: Payer: Medicare Other | Admitting: Adult Health

## 2020-09-20 NOTE — Progress Notes (Signed)
CLINIC:  Survivorship   REASON FOR VISIT:  Routine follow-up for history of breast cancer.   BRIEF ONCOLOGIC HISTORY:  Oncology History  Ductal carcinoma in situ (DCIS) of left breast  03/24/2018 Initial Diagnosis   Screening detected left breast calcifications UOQ at 3 o'clock position pleomorphic in nature 1.5 cm, axilla negative, biopsy showed high-grade DCIS with calcifications ER 0%, PR 0%, Ki-67 10%, Tis NX stage 0   05/11/2018 Surgery   Left lumpectomy: High-grade DCIS with calcifications 15 mm, DCIS focally less than 1 mm from anterior and inferior margins and 1 mm from superior margin, 2 mm from posterior and medial margins, ER 0%, PR 0%, Tis NX stage 0   05/26/2018 Cancer Staging   Staging form: Breast, AJCC 8th Edition - Pathologic: Stage 0 (pTis (DCIS), pN0, cM0, ER-, PR-) - Signed by Gardenia Phlegm, NP on 05/26/2018   06/07/2018 - 07/02/2018 Radiation Therapy   1. Left Breast / 42.56 Gy in 16 fractions 2. Left Breast Boost / 10 Gy in 4 fractions      INTERVAL HISTORY:  Cynthia Peterson presents to the [facility] today for routine follow-up for her history of breast cancer.  Overall, she reports feeling quite well.   She underwent mammogram 05/23/2020 that showed no evidence of malignancy and breast density B.  She is up to date with PCP visits, and recently went.  She says she is not having any bowel issues, and her most recent colonoscopy was at 81, and she doesn't plan to undergo one at 30.  She has had two falls recently from losing her balance.  She notes she isn't as active since she is no longer taking care of her grandchild.  She walks outside when the weather is nice.  She doesn't walk as much when the weather is poor.    REVIEW OF SYSTEMS:  Review of Systems  Constitutional: Negative for appetite change, chills, fatigue, fever and unexpected weight change.  HENT:   Negative for hearing loss, lump/mass, sore throat and trouble swallowing.   Eyes:  Negative for eye problems and icterus.  Respiratory: Negative for chest tightness, cough and shortness of breath.   Cardiovascular: Negative for chest pain, leg swelling and palpitations.  Gastrointestinal: Negative for abdominal distention, abdominal pain, constipation, diarrhea, nausea and vomiting.  Endocrine: Negative for hot flashes.  Genitourinary: Negative for difficulty urinating.   Musculoskeletal: Negative for arthralgias.  Skin: Negative for itching and rash.  Neurological: Negative for dizziness, extremity weakness and numbness.  Psychiatric/Behavioral: Negative for depression. The patient is not nervous/anxious.   Breast: Denies any new nodularity, masses, tenderness, nipple changes, or nipple discharge.       PAST MEDICAL/SURGICAL HISTORY:  Past Medical History:  Diagnosis Date  . Cancer [facility])    breast cancer, skin cancer removed to left side of nose  . Hypercholesteremia   . Hypertension   . Interstitial cystitis   . Osteoarthritis    knee  . Osteoporosis   . Stroke [facility])    at the age of 27   Past Surgical History:  Procedure Laterality Date  . BREAST LUMPECTOMY WITH RADIOACTIVE SEED LOCALIZATION Left 05/11/2018   Procedure: BREAST LUMPECTOMY WITH RADIOACTIVE SEED LOCALIZATION;  Surgeon: Stark Klein, MD;  Location: [place];  Service: General;  Laterality: Left;  . carotid surgery    . TUBAL LIGATION       ALLERGIES:  Allergies  Allergen Reactions  . Actonel [Risedronate Sodium] Other (See Comments)    Joint  Pain   . Macrodantin [Nitrofurantoin Macrocrystal] Rash  . Robitet [Tetracycline] Rash  . Sulfa Antibiotics Rash     CURRENT MEDICATIONS:  Outpatient Encounter Medications as of 09/21/2020  Medication Sig  . amLODipine (NORVASC) 5 MG tablet Take 5 mg by mouth at bedtime.   Marland Kitchen aspirin 81 MG EC tablet Take 81 mg by mouth daily.  . Calcium Carbonate+Vitamin D 600-200 MG-UNIT TABS Take 2 tablets by mouth daily.  . Cholecalciferol-Vitamin C (VITAMIN  D3-VITAMIN C) 1000-500 UNIT-MG CAPS Take 1,400 tablets by mouth daily.  Marland Kitchen FLUoxetine (PROZAC) 20 MG capsule Take 20 mg by mouth every other day. In the afternoon with lunch  . hydrocortisone 2.5 % cream Apply 2.5 application topically in the morning and at bedtime.  Marland Kitchen ketoconazole (NIZORAL) 2 % cream Apply 1 g topically in the morning and at bedtime.  . ramipril (ALTACE) 10 MG capsule Take 10 mg by mouth daily with lunch.   . rosuvastatin (CRESTOR) 20 MG tablet Take 20 mg by mouth at bedtime.  . [DISCONTINUED] aspirin 325 MG tablet Take 325 mg by mouth daily with lunch.  (Patient not taking: Reported on 09/21/2020)  . [DISCONTINUED] Calcium Carb-Cholecalciferol 600-800 MG-UNIT TABS Take 1 tablet by mouth 2 (two) times daily. (Patient not taking: Reported on 09/21/2020)  . [DISCONTINUED] simvastatin (ZOCOR) 20 MG tablet Take 20 mg by mouth at bedtime.  (Patient not taking: Reported on 09/21/2020)   No facility-administered encounter medications on file as of 09/21/2020.     ONCOLOGIC FAMILY HISTORY:  Family History  Problem Relation Age of Onset  . Colon cancer Paternal Uncle   . Breast cancer Sister 46    GENETIC COUNSELING/TESTING: Not at this time  SOCIAL HISTORY:  Social History   Socioeconomic History  . Marital status: Single    Spouse name: Not on file  . Number of children: Not on file  . Years of education: Not on file  . Highest education level: Not on file  Occupational History  . Not on file  Tobacco Use  . Smoking status: Never Smoker  . Smokeless tobacco: Never Used  Vaping Use  . Vaping Use: Never used  Substance and Sexual Activity  . Alcohol use: Not Currently  . Drug use: Never  . Sexual activity: Not Currently  Other Topics Concern  . Not on file  Social History Narrative  . Not on file   Social Determinants of Health   Financial Resource Strain: Not on file  Food Insecurity: Not on file  Transportation Needs: Not on file  Physical Activity: Not on  file  Stress: Not on file  Social Connections: Not on file  Intimate Partner Violence: Not on file      PHYSICAL EXAMINATION:  Vital Signs: Vitals:   09/21/20 1412  BP: (!) 148/82  Pulse: (!) 111  Resp: 17  Temp: 97.9 F (36.6 C)  SpO2: 98%   Filed Weights   09/21/20 1412  Weight: 131 lb 9.6 oz (59.7 kg)   General: Well-nourished, well-appearing female in no acute distress.  Unaccompanied today.   HEENT: Head is normocephalic.  Pupils equal and reactive to light. Conjunctivae clear without exudate.  Sclerae anicteric. Oral mucosa is pink, moist.  Oropharynx is pink without lesions or erythema.  Lymph: No cervical, supraclavicular, or infraclavicular lymphadenopathy noted on palpation.  Cardiovascular: Regular rate and rhythm. HR 96 on recheck. Respiratory: Clear to auscultation bilaterally. Chest expansion symmetric; breathing non-labored.  Breast Exam:  -Left breast: No appreciable masses on  palpation. No skin redness, thickening, or peau d'orange appearance; no nipple retraction or nipple discharge; mild distortion in symmetry at previous lumpectomy site well healed scar without erythema or nodularity.  -Right breast: No appreciable masses on palpation. No skin redness, thickening, or peau d'orange appearance; no nipple retraction or nipple discharge; -Axilla: No axillary adenopathy bilaterally.  GI: Abdomen soft and round; non-tender, non-distended. Bowel sounds normoactive. No hepatosplenomegaly.   GU: Deferred.  Neuro: No focal deficits. Steady gait.  Psych: Mood and affect normal and appropriate for situation.  MSK: No focal spinal tenderness to palpation, full range of motion in bilateral upper extremities Extremities: No edema. Skin: Warm and dry.  LABORATORY DATA:  None for this visit   DIAGNOSTIC IMAGING:  Most recent mammogram:     ASSESSMENT AND PLAN:  Cynthia Peterson is a pleasant 80 y.o. female with history of Stage 0 left breast DCIS, ER-/PR-, diagnosed in  02/2018, treated with lumpectomy and adjuvant radiation therapy.  She [facility] for surveillance and routine follow-up.   1. History of breast cancer:  Cynthia Peterson is currently clinically and radiographically without evidence of disease or recurrence of breast cancer. She will be due for mammogram in 04/2021; orders placed today. She will return in one year for f/u for LTS.  I encouraged her to call me with any questions or concerns before her next visit at the cancer center, and I would be happy to see her sooner, if needed.    2. Bone health:  Given Cynthia Peterson's age, history of breast cancer, she is at risk for bone demineralization. She has osteoporosis, and declines bisphosphanate therapy.  We reviewed calcium, vitamin d, and weight bearing exercises.  She was given education on specific food and activities to promote bone health.  3. Cancer screening:  Due to Cynthia Peterson's history and her age, she should receive screening for skin cancers. She was encouraged to follow-up with her PCP for appropriate cancer screenings.   4. Health maintenance and wellness promotion: Cynthia Peterson was encouraged to consume 5-7 servings of fruits and vegetables per day. She was also encouraged to engage in moderate to vigorous exercise for 30 minutes per day most days of the week. She was instructed to limit her alcohol consumption and continue to abstain from tobacco use.    Dispo:  -Return to cancer center in one year for LTS -Mammogram 04/2021  Total encounter time: 20 minutes*  Wilber Bihari, NP 09/23/20 12:37 PM Medical Oncology and Hematology [facility], [place] [address] Tel. [phone]    Fax. [phone]  *Total Encounter Time as defined by the Centers for Medicare and Medicaid Services includes, in addition to the face-to-face time of a patient visit (documented in the note above) non-face-to-face time: obtaining and reviewing outside history,  ordering and reviewing medications, tests or procedures, care coordination (communications with other health care professionals or caregivers) and documentation in the medical record.    Note: PRIMARY CARE PROVIDER Leeroy Cha, [place] [phone]

## 2020-09-21 ENCOUNTER — Inpatient Hospital Stay: Payer: Medicare Other | Attending: Adult Health | Admitting: Adult Health

## 2020-09-21 ENCOUNTER — Other Ambulatory Visit: Payer: Self-pay

## 2020-09-21 ENCOUNTER — Encounter: Payer: Self-pay | Admitting: Adult Health

## 2020-09-21 VITALS — BP 148/82 | HR 111 | Temp 97.9°F | Resp 17 | Ht 64.5 in | Wt 131.6 lb

## 2020-09-21 DIAGNOSIS — I10 Essential (primary) hypertension: Secondary | ICD-10-CM | POA: Diagnosis not present

## 2020-09-21 DIAGNOSIS — Z171 Estrogen receptor negative status [ER-]: Secondary | ICD-10-CM | POA: Diagnosis not present

## 2020-09-21 DIAGNOSIS — D0512 Intraductal carcinoma in situ of left breast: Secondary | ICD-10-CM

## 2020-09-21 DIAGNOSIS — M810 Age-related osteoporosis without current pathological fracture: Secondary | ICD-10-CM | POA: Diagnosis not present

## 2020-09-21 DIAGNOSIS — Z853 Personal history of malignant neoplasm of breast: Secondary | ICD-10-CM | POA: Diagnosis not present

## 2020-09-21 DIAGNOSIS — E7800 Pure hypercholesterolemia, unspecified: Secondary | ICD-10-CM | POA: Insufficient documentation

## 2020-09-21 DIAGNOSIS — Z79899 Other long term (current) drug therapy: Secondary | ICD-10-CM | POA: Diagnosis not present

## 2020-09-21 DIAGNOSIS — Z923 Personal history of irradiation: Secondary | ICD-10-CM | POA: Diagnosis not present

## 2020-09-21 DIAGNOSIS — Z7982 Long term (current) use of aspirin: Secondary | ICD-10-CM | POA: Insufficient documentation

## 2020-10-05 ENCOUNTER — Other Ambulatory Visit: Payer: Self-pay | Admitting: Internal Medicine

## 2020-10-05 DIAGNOSIS — I779 Disorder of arteries and arterioles, unspecified: Secondary | ICD-10-CM | POA: Diagnosis not present

## 2020-10-05 DIAGNOSIS — Z1389 Encounter for screening for other disorder: Secondary | ICD-10-CM | POA: Diagnosis not present

## 2020-10-05 DIAGNOSIS — M810 Age-related osteoporosis without current pathological fracture: Secondary | ICD-10-CM | POA: Diagnosis not present

## 2020-10-05 DIAGNOSIS — E785 Hyperlipidemia, unspecified: Secondary | ICD-10-CM | POA: Diagnosis not present

## 2020-10-05 DIAGNOSIS — I10 Essential (primary) hypertension: Secondary | ICD-10-CM | POA: Diagnosis not present

## 2020-10-05 DIAGNOSIS — Z0000 Encounter for general adult medical examination without abnormal findings: Secondary | ICD-10-CM | POA: Diagnosis not present

## 2020-10-05 DIAGNOSIS — G43009 Migraine without aura, not intractable, without status migrainosus: Secondary | ICD-10-CM | POA: Diagnosis not present

## 2020-10-08 DIAGNOSIS — L218 Other seborrheic dermatitis: Secondary | ICD-10-CM | POA: Diagnosis not present

## 2020-10-08 DIAGNOSIS — L570 Actinic keratosis: Secondary | ICD-10-CM | POA: Diagnosis not present

## 2020-10-16 DIAGNOSIS — M810 Age-related osteoporosis without current pathological fracture: Secondary | ICD-10-CM | POA: Diagnosis not present

## 2020-10-16 DIAGNOSIS — K219 Gastro-esophageal reflux disease without esophagitis: Secondary | ICD-10-CM | POA: Diagnosis not present

## 2020-10-16 DIAGNOSIS — G43009 Migraine without aura, not intractable, without status migrainosus: Secondary | ICD-10-CM | POA: Diagnosis not present

## 2020-10-16 DIAGNOSIS — E785 Hyperlipidemia, unspecified: Secondary | ICD-10-CM | POA: Diagnosis not present

## 2020-10-16 DIAGNOSIS — I10 Essential (primary) hypertension: Secondary | ICD-10-CM | POA: Diagnosis not present

## 2020-10-16 DIAGNOSIS — C50212 Malignant neoplasm of upper-inner quadrant of left female breast: Secondary | ICD-10-CM | POA: Diagnosis not present

## 2020-11-28 DIAGNOSIS — C50212 Malignant neoplasm of upper-inner quadrant of left female breast: Secondary | ICD-10-CM | POA: Diagnosis not present

## 2020-11-28 DIAGNOSIS — G43009 Migraine without aura, not intractable, without status migrainosus: Secondary | ICD-10-CM | POA: Diagnosis not present

## 2020-11-28 DIAGNOSIS — I10 Essential (primary) hypertension: Secondary | ICD-10-CM | POA: Diagnosis not present

## 2020-11-28 DIAGNOSIS — E785 Hyperlipidemia, unspecified: Secondary | ICD-10-CM | POA: Diagnosis not present

## 2020-11-28 DIAGNOSIS — K219 Gastro-esophageal reflux disease without esophagitis: Secondary | ICD-10-CM | POA: Diagnosis not present

## 2020-11-28 DIAGNOSIS — M810 Age-related osteoporosis without current pathological fracture: Secondary | ICD-10-CM | POA: Diagnosis not present

## 2020-12-27 DIAGNOSIS — I10 Essential (primary) hypertension: Secondary | ICD-10-CM | POA: Diagnosis not present

## 2020-12-27 DIAGNOSIS — E785 Hyperlipidemia, unspecified: Secondary | ICD-10-CM | POA: Diagnosis not present

## 2020-12-27 DIAGNOSIS — K219 Gastro-esophageal reflux disease without esophagitis: Secondary | ICD-10-CM | POA: Diagnosis not present

## 2020-12-27 DIAGNOSIS — M810 Age-related osteoporosis without current pathological fracture: Secondary | ICD-10-CM | POA: Diagnosis not present

## 2020-12-27 DIAGNOSIS — G43009 Migraine without aura, not intractable, without status migrainosus: Secondary | ICD-10-CM | POA: Diagnosis not present

## 2021-02-19 DIAGNOSIS — C44612 Basal cell carcinoma of skin of right upper limb, including shoulder: Secondary | ICD-10-CM | POA: Diagnosis not present

## 2021-02-19 DIAGNOSIS — Z85828 Personal history of other malignant neoplasm of skin: Secondary | ICD-10-CM | POA: Diagnosis not present

## 2021-02-19 DIAGNOSIS — L821 Other seborrheic keratosis: Secondary | ICD-10-CM | POA: Diagnosis not present

## 2021-02-19 DIAGNOSIS — D225 Melanocytic nevi of trunk: Secondary | ICD-10-CM | POA: Diagnosis not present

## 2021-02-19 DIAGNOSIS — L905 Scar conditions and fibrosis of skin: Secondary | ICD-10-CM | POA: Diagnosis not present

## 2021-02-19 DIAGNOSIS — L570 Actinic keratosis: Secondary | ICD-10-CM | POA: Diagnosis not present

## 2021-02-21 DIAGNOSIS — G43009 Migraine without aura, not intractable, without status migrainosus: Secondary | ICD-10-CM | POA: Diagnosis not present

## 2021-02-21 DIAGNOSIS — K219 Gastro-esophageal reflux disease without esophagitis: Secondary | ICD-10-CM | POA: Diagnosis not present

## 2021-02-21 DIAGNOSIS — M810 Age-related osteoporosis without current pathological fracture: Secondary | ICD-10-CM | POA: Diagnosis not present

## 2021-02-21 DIAGNOSIS — C50212 Malignant neoplasm of upper-inner quadrant of left female breast: Secondary | ICD-10-CM | POA: Diagnosis not present

## 2021-02-21 DIAGNOSIS — I10 Essential (primary) hypertension: Secondary | ICD-10-CM | POA: Diagnosis not present

## 2021-02-21 DIAGNOSIS — E785 Hyperlipidemia, unspecified: Secondary | ICD-10-CM | POA: Diagnosis not present

## 2021-03-01 ENCOUNTER — Encounter: Payer: Self-pay | Admitting: General Surgery

## 2021-03-01 DIAGNOSIS — C50212 Malignant neoplasm of upper-inner quadrant of left female breast: Secondary | ICD-10-CM | POA: Diagnosis not present

## 2021-03-01 DIAGNOSIS — Z171 Estrogen receptor negative status [ER-]: Secondary | ICD-10-CM | POA: Diagnosis not present

## 2021-03-07 ENCOUNTER — Other Ambulatory Visit: Payer: Self-pay

## 2021-03-07 ENCOUNTER — Ambulatory Visit
Admission: RE | Admit: 2021-03-07 | Discharge: 2021-03-07 | Disposition: A | Discharge status: Home / Self Care | Payer: Medicare Other | Source: Ambulatory Visit | Attending: Internal Medicine | Admitting: Internal Medicine

## 2021-03-07 DIAGNOSIS — Z780 Asymptomatic menopausal state: Secondary | ICD-10-CM | POA: Diagnosis not present

## 2021-03-07 DIAGNOSIS — M810 Age-related osteoporosis without current pathological fracture: Secondary | ICD-10-CM | POA: Diagnosis not present

## 2021-03-20 DIAGNOSIS — K219 Gastro-esophageal reflux disease without esophagitis: Secondary | ICD-10-CM | POA: Diagnosis not present

## 2021-03-20 DIAGNOSIS — G43009 Migraine without aura, not intractable, without status migrainosus: Secondary | ICD-10-CM | POA: Diagnosis not present

## 2021-03-20 DIAGNOSIS — I10 Essential (primary) hypertension: Secondary | ICD-10-CM | POA: Diagnosis not present

## 2021-03-20 DIAGNOSIS — M810 Age-related osteoporosis without current pathological fracture: Secondary | ICD-10-CM | POA: Diagnosis not present

## 2021-03-20 DIAGNOSIS — C50212 Malignant neoplasm of upper-inner quadrant of left female breast: Secondary | ICD-10-CM | POA: Diagnosis not present

## 2021-03-20 DIAGNOSIS — E785 Hyperlipidemia, unspecified: Secondary | ICD-10-CM | POA: Diagnosis not present

## 2021-04-08 DIAGNOSIS — Z0000 Encounter for general adult medical examination without abnormal findings: Secondary | ICD-10-CM | POA: Diagnosis not present

## 2021-04-08 DIAGNOSIS — Z1389 Encounter for screening for other disorder: Secondary | ICD-10-CM | POA: Diagnosis not present

## 2021-04-08 DIAGNOSIS — I10 Essential (primary) hypertension: Secondary | ICD-10-CM | POA: Diagnosis not present

## 2021-04-08 DIAGNOSIS — I779 Disorder of arteries and arterioles, unspecified: Secondary | ICD-10-CM | POA: Diagnosis not present

## 2021-04-08 DIAGNOSIS — E785 Hyperlipidemia, unspecified: Secondary | ICD-10-CM | POA: Diagnosis not present

## 2021-04-08 DIAGNOSIS — Z79899 Other long term (current) drug therapy: Secondary | ICD-10-CM | POA: Diagnosis not present

## 2021-04-08 DIAGNOSIS — M810 Age-related osteoporosis without current pathological fracture: Secondary | ICD-10-CM | POA: Diagnosis not present

## 2021-04-08 DIAGNOSIS — K219 Gastro-esophageal reflux disease without esophagitis: Secondary | ICD-10-CM | POA: Diagnosis not present

## 2021-05-22 DIAGNOSIS — L570 Actinic keratosis: Secondary | ICD-10-CM | POA: Diagnosis not present

## 2021-05-22 DIAGNOSIS — D485 Neoplasm of uncertain behavior of skin: Secondary | ICD-10-CM | POA: Diagnosis not present

## 2021-05-22 DIAGNOSIS — K219 Gastro-esophageal reflux disease without esophagitis: Secondary | ICD-10-CM | POA: Diagnosis not present

## 2021-05-22 DIAGNOSIS — C50212 Malignant neoplasm of upper-inner quadrant of left female breast: Secondary | ICD-10-CM | POA: Diagnosis not present

## 2021-05-22 DIAGNOSIS — Z08 Encounter for follow-up examination after completed treatment for malignant neoplasm: Secondary | ICD-10-CM | POA: Diagnosis not present

## 2021-05-22 DIAGNOSIS — L814 Other melanin hyperpigmentation: Secondary | ICD-10-CM | POA: Diagnosis not present

## 2021-05-22 DIAGNOSIS — M810 Age-related osteoporosis without current pathological fracture: Secondary | ICD-10-CM | POA: Diagnosis not present

## 2021-05-22 DIAGNOSIS — D225 Melanocytic nevi of trunk: Secondary | ICD-10-CM | POA: Diagnosis not present

## 2021-05-22 DIAGNOSIS — Z85828 Personal history of other malignant neoplasm of skin: Secondary | ICD-10-CM | POA: Diagnosis not present

## 2021-05-22 DIAGNOSIS — E785 Hyperlipidemia, unspecified: Secondary | ICD-10-CM | POA: Diagnosis not present

## 2021-05-22 DIAGNOSIS — G43009 Migraine without aura, not intractable, without status migrainosus: Secondary | ICD-10-CM | POA: Diagnosis not present

## 2021-05-22 DIAGNOSIS — L84 Corns and callosities: Secondary | ICD-10-CM | POA: Diagnosis not present

## 2021-05-22 DIAGNOSIS — L821 Other seborrheic keratosis: Secondary | ICD-10-CM | POA: Diagnosis not present

## 2021-05-22 DIAGNOSIS — I10 Essential (primary) hypertension: Secondary | ICD-10-CM | POA: Diagnosis not present

## 2021-05-23 DIAGNOSIS — Z853 Personal history of malignant neoplasm of breast: Secondary | ICD-10-CM | POA: Diagnosis not present

## 2021-07-09 DIAGNOSIS — L0101 Non-bullous impetigo: Secondary | ICD-10-CM | POA: Diagnosis not present

## 2021-07-09 DIAGNOSIS — L244 Irritant contact dermatitis due to drugs in contact with skin: Secondary | ICD-10-CM | POA: Diagnosis not present

## 2021-07-29 DIAGNOSIS — I10 Essential (primary) hypertension: Secondary | ICD-10-CM | POA: Diagnosis not present

## 2021-07-29 DIAGNOSIS — G43009 Migraine without aura, not intractable, without status migrainosus: Secondary | ICD-10-CM | POA: Diagnosis not present

## 2021-07-29 DIAGNOSIS — C50212 Malignant neoplasm of upper-inner quadrant of left female breast: Secondary | ICD-10-CM | POA: Diagnosis not present

## 2021-07-29 DIAGNOSIS — E785 Hyperlipidemia, unspecified: Secondary | ICD-10-CM | POA: Diagnosis not present

## 2021-07-29 DIAGNOSIS — K219 Gastro-esophageal reflux disease without esophagitis: Secondary | ICD-10-CM | POA: Diagnosis not present

## 2021-07-29 DIAGNOSIS — M810 Age-related osteoporosis without current pathological fracture: Secondary | ICD-10-CM | POA: Diagnosis not present

## 2021-08-21 DIAGNOSIS — D485 Neoplasm of uncertain behavior of skin: Secondary | ICD-10-CM | POA: Diagnosis not present

## 2021-08-21 DIAGNOSIS — L244 Irritant contact dermatitis due to drugs in contact with skin: Secondary | ICD-10-CM | POA: Diagnosis not present

## 2021-08-21 DIAGNOSIS — L820 Inflamed seborrheic keratosis: Secondary | ICD-10-CM | POA: Diagnosis not present

## 2021-08-21 DIAGNOSIS — D692 Other nonthrombocytopenic purpura: Secondary | ICD-10-CM | POA: Diagnosis not present

## 2021-08-21 DIAGNOSIS — L818 Other specified disorders of pigmentation: Secondary | ICD-10-CM | POA: Diagnosis not present

## 2021-08-21 DIAGNOSIS — L570 Actinic keratosis: Secondary | ICD-10-CM | POA: Diagnosis not present

## 2021-09-22 NOTE — Progress Notes (Signed)
CLINIC:  Survivorship   REASON FOR VISIT:  Routine follow-up for history of breast cancer.   BRIEF ONCOLOGIC HISTORY:  Oncology History  Ductal carcinoma in situ (DCIS) of left breast  03/24/2018 Initial Diagnosis   Screening detected left breast calcifications UOQ at 3 o'clock position pleomorphic in nature 1.5 cm, axilla negative, biopsy showed high-grade DCIS with calcifications ER 0%, PR 0%, Ki-67 10%, Tis NX stage 0   05/11/2018 Surgery   Left lumpectomy: High-grade DCIS with calcifications 15 mm, DCIS focally less than 1 mm from anterior and inferior margins and 1 mm from superior margin, 2 mm from posterior and medial margins, ER 0%, PR 0%, Tis NX stage 0   05/26/2018 Cancer Staging   Staging form: Breast, AJCC 8th Edition - Pathologic: Stage 0 (pTis (DCIS), pN0, cM0, ER-, PR-) - Signed by Gardenia Phlegm, NP on 05/26/2018   06/07/2018 - 07/02/2018 Radiation Therapy   1. Left Breast / 42.56 Gy in 16 fractions 2. Left Breast Boost / 10 Gy in 4 fractions      INTERVAL HISTORY:  Ms. Caridi presents to the [facility] today for routine follow-up for her history of breast cancer.  Overall, she reports feeling quite well.   She underwent mammogram at [facility], we are waiting copy of the results.  She tells me that she completed this in September of this past year and it was normal.  She continues to see primary care regularly.  She sees dermatology regularly as well.  She notes that she is up-to-date with her other cancer screenings and has largely graduated from these at this point.  She is active by walking [place].  She denies any breast changes or concerns today.  She continues to see Dr. Barry Dienes once a year typically 6 months after her visit with me.    REVIEW OF SYSTEMS:  Review of Systems  Constitutional:  Negative for appetite change, chills, fatigue, fever and unexpected weight  change.  HENT:   Negative for hearing loss, lump/mass, sore throat and trouble swallowing.   Eyes:  Negative for eye problems and icterus.  Respiratory:  Negative for chest tightness, cough and shortness of breath.   Cardiovascular:  Negative for chest pain, leg swelling and palpitations.  Gastrointestinal:  Negative for abdominal distention, abdominal pain, constipation, diarrhea, nausea and vomiting.  Endocrine: Negative for hot flashes.  Genitourinary:  Negative for difficulty urinating.   Musculoskeletal:  Negative for arthralgias.  Skin:  Negative for itching and rash.  Neurological:  Negative for dizziness, extremity weakness and numbness.  Psychiatric/Behavioral:  Negative for depression. The patient is not nervous/anxious.  Breast: Denies any new nodularity, masses, tenderness, nipple changes, or nipple discharge.       PAST MEDICAL/SURGICAL HISTORY:  Past Medical History:  Diagnosis Date   Cancer ([place])    breast cancer, skin cancer removed to left side of nose   Hypercholesteremia    Hypertension    Interstitial cystitis    Osteoarthritis    knee   Osteoporosis    Stroke ([place])    at the age of 28   Past Surgical History:  Procedure Laterality Date   BREAST LUMPECTOMY WITH RADIOACTIVE SEED LOCALIZATION Left 05/11/2018   Procedure: BREAST LUMPECTOMY WITH RADIOACTIVE SEED LOCALIZATION;  Surgeon: Stark Klein, MD;  Location: [facility];  Service: General;  Laterality: Left;   carotid surgery     TUBAL  LIGATION       ALLERGIES:  Allergies  Allergen Reactions   Actonel [Risedronate Sodium] Other (See Comments)    Joint Pain    Macrodantin [Nitrofurantoin Macrocrystal] Rash   Robitet [Tetracycline] Rash   Sulfa Antibiotics Rash     CURRENT MEDICATIONS:  Outpatient Encounter Medications as of 09/23/2021  Medication Sig   amLODipine (NORVASC) 5 MG tablet Take 5 mg by mouth at bedtime.    aspirin 81 MG EC tablet Take 81 mg by mouth daily.   Calcium Carbonate+Vitamin D  600-200 MG-UNIT TABS Take 2 tablets by mouth daily.   Cholecalciferol-Vitamin C (VITAMIN D3-VITAMIN C) 1000-500 UNIT-MG CAPS Take 1,400 tablets by mouth daily.   FLUoxetine (PROZAC) 20 MG capsule Take 20 mg by mouth every other day. In the afternoon with lunch   hydrocortisone 2.5 % cream Apply 2.5 application topically in the morning and at bedtime.   ketoconazole (NIZORAL) 2 % cream Apply 1 g topically in the morning and at bedtime.   ramipril (ALTACE) 10 MG capsule Take 10 mg by mouth daily with lunch.    rosuvastatin (CRESTOR) 20 MG tablet Take 20 mg by mouth at bedtime.   No facility-administered encounter medications on file as of 09/23/2021.     ONCOLOGIC FAMILY HISTORY:  Family History  Problem Relation Age of Onset   Colon cancer Paternal Uncle    Breast cancer Sister 68    GENETIC COUNSELING/TESTING: Not at this time  SOCIAL HISTORY:  Social History   Socioeconomic History   Marital status: Single    Spouse name: Not on file   Number of children: Not on file   Years of education: Not on file   Highest education level: Not on file  Occupational History   Not on file  Tobacco Use   Smoking status: Never   Smokeless tobacco: Never  Vaping Use   Vaping Use: Never used  Substance and Sexual Activity   Alcohol use: Not Currently   Drug use: Never   Sexual activity: Not Currently  Other Topics Concern   Not on file  Social History Narrative   Not on file   Social Determinants of Health   Financial Resource Strain: Not on file  Food Insecurity: Not on file  Transportation Needs: Not on file  Physical Activity: Not on file  Stress: Not on file  Social Connections: Not on file  Intimate Partner Violence: Not on file      PHYSICAL EXAMINATION:  Vital Signs: There were no vitals filed for this visit.  There were no vitals filed for this visit.  General: Well-nourished, well-appearing female in no acute distress.  Unaccompanied today.   HEENT: Head is  normocephalic.  Pupils equal and reactive to light. Conjunctivae clear without exudate.  Sclerae anicteric. Oral mucosa is pink, moist.  Oropharynx is pink without lesions or erythema.  Lymph: No cervical, supraclavicular, or infraclavicular lymphadenopathy noted on palpation.  Cardiovascular: Regular rate and rhythm. HR 96 on recheck. Respiratory: Clear to auscultation bilaterally. Chest expansion symmetric; breathing non-labored.  Breast Exam:  -Left breast: No appreciable masses on palpation. No skin redness, thickening, or peau d'orange appearance; no nipple retraction or nipple discharge; mild distortion in symmetry at previous lumpectomy site well healed scar without erythema or nodularity.  -Right breast: No appreciable masses on palpation. No skin redness, thickening, or peau d'orange appearance; no nipple retraction or nipple discharge; -Axilla: No axillary adenopathy bilaterally.  GI: Abdomen soft and round; non-tender, non-distended. Bowel sounds normoactive. No  hepatosplenomegaly.   GU: Deferred.  Neuro: No focal deficits. Steady gait.  Psych: Mood and affect normal and appropriate for situation.  MSK: No focal spinal tenderness to palpation, full range of motion in bilateral upper extremities Extremities: No edema. Skin: Warm and dry.  LABORATORY DATA:  None for this visit   DIAGNOSTIC IMAGING:  Most recent mammogram: Awaiting results    ASSESSMENT AND PLAN:  Ms.. Shampine is a pleasant 81 y.o. female with history of Stage 0 left breast DCIS, ER-/PR-, diagnosed in 02/2018, treated with lumpectomy and adjuvant radiation therapy.  [facility] for surveillance and routine follow-up.   1. History of breast cancer:  Ms. Redford is currently clinically and radiographically without evidence of disease or recurrence of breast cancer. She will be due for mammogram in 04/2022; orders placed today. She will return in one year for f/u for LTS.  She is looking forward  to her next visit because she will graduate from follow-up at that time.  I encouraged her to call me with any questions or concerns before her next visit at the cancer center, and I would be happy to see her sooner, if needed.    2. Bone health:  Given Ms. Kruzel's age, history of breast cancer, she is at risk for bone demineralization. She has osteoporosis, and declines bisphosphanate therapy.  Her most recent mammogram was completed earlier in 2022 and her osteoporosis is worse with a T score of -4.1 in the left femur.  We reviewed calcium, vitamin d, and weight bearing exercises.  She was given education on specific food and activities to promote bone health.  3. Cancer screening:  Due to Ms. Steinberger's history and her age, she should receive screening for skin cancers. She was encouraged to follow-up with her PCP for appropriate cancer screenings.   4. Health maintenance and wellness promotion: Ms. Rockholt was encouraged to consume 5-7 servings of fruits and vegetables per day. She was also encouraged to engage in moderate to vigorous exercise for 30 minutes per day most days of the week. She was instructed to limit her alcohol consumption and continue to abstain from tobacco use.    Dispo:  -Return to cancer center in one year for LTS -Mammogram 04/2022  Total encounter time: 30 minutes*in face-to-face visit time, chart review, lab review, care coordination, order entry, and documentation of the encounter.  Wilber Bihari, NP 09/22/21 9:39 PM Medical Oncology and Hematology [facility] [place], [place] [address] Tel. [phone]    Fax. [phone]  *Total Encounter Time as defined by the Centers for Medicare and Medicaid Services includes, in addition to the face-to-face time of a patient visit (documented in the note above) non-face-to-face time: obtaining and reviewing outside history, ordering and reviewing medications, tests or procedures, care coordination  (communications with other health care professionals or caregivers) and documentation in the medical record.    Note: PRIMARY CARE PROVIDER Leeroy Cha, [employer] [phone]

## 2021-09-23 ENCOUNTER — Encounter: Payer: Self-pay | Admitting: Adult Health

## 2021-09-23 ENCOUNTER — Other Ambulatory Visit: Payer: Self-pay

## 2021-09-23 ENCOUNTER — Inpatient Hospital Stay: Payer: Medicare Other | Attending: Adult Health | Admitting: Adult Health

## 2021-09-23 VITALS — BP 145/82 | HR 112 | Temp 97.9°F | Resp 18 | Wt 126.6 lb

## 2021-09-23 DIAGNOSIS — D0512 Intraductal carcinoma in situ of left breast: Secondary | ICD-10-CM | POA: Diagnosis not present

## 2021-09-23 DIAGNOSIS — Z923 Personal history of irradiation: Secondary | ICD-10-CM | POA: Insufficient documentation

## 2021-09-23 DIAGNOSIS — Z171 Estrogen receptor negative status [ER-]: Secondary | ICD-10-CM | POA: Insufficient documentation

## 2021-09-23 DIAGNOSIS — M810 Age-related osteoporosis without current pathological fracture: Secondary | ICD-10-CM | POA: Insufficient documentation

## 2021-10-11 DIAGNOSIS — I779 Disorder of arteries and arterioles, unspecified: Secondary | ICD-10-CM | POA: Diagnosis not present

## 2021-10-11 DIAGNOSIS — Z8673 Personal history of transient ischemic attack (TIA), and cerebral infarction without residual deficits: Secondary | ICD-10-CM | POA: Diagnosis not present

## 2021-10-11 DIAGNOSIS — I10 Essential (primary) hypertension: Secondary | ICD-10-CM | POA: Diagnosis not present

## 2021-10-11 DIAGNOSIS — C50212 Malignant neoplasm of upper-inner quadrant of left female breast: Secondary | ICD-10-CM | POA: Diagnosis not present

## 2021-10-11 DIAGNOSIS — E785 Hyperlipidemia, unspecified: Secondary | ICD-10-CM | POA: Diagnosis not present

## 2021-10-11 DIAGNOSIS — Z8249 Family history of ischemic heart disease and other diseases of the circulatory system: Secondary | ICD-10-CM | POA: Diagnosis not present

## 2021-10-11 DIAGNOSIS — R200 Anesthesia of skin: Secondary | ICD-10-CM | POA: Diagnosis not present

## 2021-10-18 DIAGNOSIS — I6523 Occlusion and stenosis of bilateral carotid arteries: Secondary | ICD-10-CM | POA: Diagnosis not present

## 2021-10-18 DIAGNOSIS — I779 Disorder of arteries and arterioles, unspecified: Secondary | ICD-10-CM | POA: Diagnosis not present

## 2021-11-07 DIAGNOSIS — E785 Hyperlipidemia, unspecified: Secondary | ICD-10-CM | POA: Diagnosis not present

## 2021-11-07 DIAGNOSIS — M810 Age-related osteoporosis without current pathological fracture: Secondary | ICD-10-CM | POA: Diagnosis not present

## 2021-11-07 DIAGNOSIS — I10 Essential (primary) hypertension: Secondary | ICD-10-CM | POA: Diagnosis not present

## 2021-11-19 DIAGNOSIS — Z85828 Personal history of other malignant neoplasm of skin: Secondary | ICD-10-CM | POA: Diagnosis not present

## 2021-11-19 DIAGNOSIS — L708 Other acne: Secondary | ICD-10-CM | POA: Diagnosis not present

## 2021-11-19 DIAGNOSIS — L218 Other seborrheic dermatitis: Secondary | ICD-10-CM | POA: Diagnosis not present

## 2021-11-19 DIAGNOSIS — L570 Actinic keratosis: Secondary | ICD-10-CM | POA: Diagnosis not present

## 2021-11-19 DIAGNOSIS — L814 Other melanin hyperpigmentation: Secondary | ICD-10-CM | POA: Diagnosis not present

## 2021-11-19 DIAGNOSIS — Z08 Encounter for follow-up examination after completed treatment for malignant neoplasm: Secondary | ICD-10-CM | POA: Diagnosis not present

## 2021-11-19 DIAGNOSIS — L821 Other seborrheic keratosis: Secondary | ICD-10-CM | POA: Diagnosis not present

## 2021-11-19 DIAGNOSIS — D225 Melanocytic nevi of trunk: Secondary | ICD-10-CM | POA: Diagnosis not present

## 2021-11-19 DIAGNOSIS — D485 Neoplasm of uncertain behavior of skin: Secondary | ICD-10-CM | POA: Diagnosis not present

## 2022-03-28 DIAGNOSIS — C50212 Malignant neoplasm of upper-inner quadrant of left female breast: Secondary | ICD-10-CM | POA: Diagnosis not present

## 2022-03-28 DIAGNOSIS — Z171 Estrogen receptor negative status [ER-]: Secondary | ICD-10-CM | POA: Diagnosis not present

## 2022-04-15 DIAGNOSIS — I10 Essential (primary) hypertension: Secondary | ICD-10-CM | POA: Diagnosis not present

## 2022-04-15 DIAGNOSIS — Z8673 Personal history of transient ischemic attack (TIA), and cerebral infarction without residual deficits: Secondary | ICD-10-CM | POA: Diagnosis not present

## 2022-04-15 DIAGNOSIS — K219 Gastro-esophageal reflux disease without esophagitis: Secondary | ICD-10-CM | POA: Diagnosis not present

## 2022-04-15 DIAGNOSIS — Z0000 Encounter for general adult medical examination without abnormal findings: Secondary | ICD-10-CM | POA: Diagnosis not present

## 2022-04-15 DIAGNOSIS — M810 Age-related osteoporosis without current pathological fracture: Secondary | ICD-10-CM | POA: Diagnosis not present

## 2022-04-15 DIAGNOSIS — E785 Hyperlipidemia, unspecified: Secondary | ICD-10-CM | POA: Diagnosis not present

## 2022-05-22 DIAGNOSIS — L821 Other seborrheic keratosis: Secondary | ICD-10-CM | POA: Diagnosis not present

## 2022-05-22 DIAGNOSIS — Z85828 Personal history of other malignant neoplasm of skin: Secondary | ICD-10-CM | POA: Diagnosis not present

## 2022-05-22 DIAGNOSIS — L570 Actinic keratosis: Secondary | ICD-10-CM | POA: Diagnosis not present

## 2022-05-22 DIAGNOSIS — Z08 Encounter for follow-up examination after completed treatment for malignant neoplasm: Secondary | ICD-10-CM | POA: Diagnosis not present

## 2022-05-22 DIAGNOSIS — L814 Other melanin hyperpigmentation: Secondary | ICD-10-CM | POA: Diagnosis not present

## 2022-05-22 DIAGNOSIS — D225 Melanocytic nevi of trunk: Secondary | ICD-10-CM | POA: Diagnosis not present

## 2022-05-27 DIAGNOSIS — Z1231 Encounter for screening mammogram for malignant neoplasm of breast: Secondary | ICD-10-CM | POA: Diagnosis not present

## 2022-08-05 DIAGNOSIS — K130 Diseases of lips: Secondary | ICD-10-CM | POA: Diagnosis not present

## 2022-08-05 DIAGNOSIS — L570 Actinic keratosis: Secondary | ICD-10-CM | POA: Diagnosis not present

## 2022-09-23 ENCOUNTER — Inpatient Hospital Stay: Payer: Medicare Other | Attending: Adult Health | Admitting: Adult Health

## 2022-09-23 ENCOUNTER — Encounter: Payer: Self-pay | Admitting: Adult Health

## 2022-09-23 ENCOUNTER — Other Ambulatory Visit: Payer: Self-pay

## 2022-09-23 VITALS — BP 146/98 | HR 126 | Temp 97.5°F | Resp 18 | Ht 64.5 in | Wt 123.2 lb

## 2022-09-23 DIAGNOSIS — L259 Unspecified contact dermatitis, unspecified cause: Secondary | ICD-10-CM | POA: Insufficient documentation

## 2022-09-23 DIAGNOSIS — D0512 Intraductal carcinoma in situ of left breast: Secondary | ICD-10-CM | POA: Insufficient documentation

## 2022-09-23 DIAGNOSIS — Z923 Personal history of irradiation: Secondary | ICD-10-CM | POA: Diagnosis not present

## 2022-09-23 DIAGNOSIS — K219 Gastro-esophageal reflux disease without esophagitis: Secondary | ICD-10-CM | POA: Insufficient documentation

## 2022-09-23 DIAGNOSIS — I779 Disorder of arteries and arterioles, unspecified: Secondary | ICD-10-CM | POA: Insufficient documentation

## 2022-09-23 DIAGNOSIS — E785 Hyperlipidemia, unspecified: Secondary | ICD-10-CM | POA: Insufficient documentation

## 2022-09-23 DIAGNOSIS — Z8673 Personal history of transient ischemic attack (TIA), and cerebral infarction without residual deficits: Secondary | ICD-10-CM | POA: Insufficient documentation

## 2022-09-23 DIAGNOSIS — I10 Essential (primary) hypertension: Secondary | ICD-10-CM | POA: Insufficient documentation

## 2022-09-23 DIAGNOSIS — Z0000 Encounter for general adult medical examination without abnormal findings: Secondary | ICD-10-CM | POA: Insufficient documentation

## 2022-09-23 DIAGNOSIS — G43009 Migraine without aura, not intractable, without status migrainosus: Secondary | ICD-10-CM | POA: Insufficient documentation

## 2022-09-23 DIAGNOSIS — M810 Age-related osteoporosis without current pathological fracture: Secondary | ICD-10-CM | POA: Insufficient documentation

## 2022-09-23 DIAGNOSIS — K146 Glossodynia: Secondary | ICD-10-CM | POA: Insufficient documentation

## 2022-09-23 NOTE — Progress Notes (Signed)
[facility] Follow up:    Cynthia Cha, MD [address] [address]   DIAGNOSIS:  Cancer Staging  Ductal carcinoma in situ (DCIS) of left breast Staging form: Breast, AJCC 8th Edition - Clinical: Stage 0 (cTis (DCIS), cN0, cM0, ER-, PR-, HER2: Not Assessed) - Unsigned Method of lymph node assessment: Clinical Nuclear grade: G3 Laterality: Left Tumor size (mm): 15 - Pathologic: Stage 0 (pTis (DCIS), pN0, cM0, ER-, PR-) - Signed by Gardenia Phlegm, NP on 05/26/2018   SUMMARY OF ONCOLOGIC HISTORY: Oncology History  Ductal carcinoma in situ (DCIS) of left breast  03/24/2018 Initial Diagnosis   Screening detected left breast calcifications UOQ at 3 o'clock position pleomorphic in nature 1.5 cm, axilla negative, biopsy showed high-grade DCIS with calcifications ER 0%, PR 0%, Ki-67 10%, Tis NX stage 0   05/11/2018 Surgery   Left lumpectomy: High-grade DCIS with calcifications 15 mm, DCIS focally less than 1 mm from anterior and inferior margins and 1 mm from superior margin, 2 mm from posterior and medial margins, ER 0%, PR 0%, Tis NX stage 0   05/26/2018 Cancer Staging   Staging form: Breast, AJCC 8th Edition - Pathologic: Stage 0 (pTis (DCIS), pN0, cM0, ER-, PR-) - Signed by Gardenia Phlegm, NP on 05/26/2018   06/07/2018 - 07/02/2018 Radiation Therapy   1. Left Breast / 42.56 Gy in 16 fractions 2. Left Breast Boost / 10 Gy in 4 fractions     CURRENT THERAPY: Observation  INTERVAL HISTORY: Cynthia Peterson 82 y.o. female returns for follow-up and evaluation of her history of noninvasive breast cancer that was ER/PR negative diagnosed in 2019.  She continues to undergo annual mammograms most recently on May 27, 2022 demonstrating no mammographic evidence of malignancy and breast density category B.  She underwent bone density testing on March 07, 2021 which demonstrated osteoporosis with a T-score of -4.1 in the  left femur.  She continues to see her primary care provider regularly and is up-to-date with her health maintenance.   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  MEDICAL HISTORY: Past Medical History:  Diagnosis Date   Cancer [facility])    breast cancer, skin cancer removed to left side of nose   Hypercholesteremia    Hypertension    Interstitial cystitis    Osteoarthritis    knee   Osteoporosis    Stroke ([place])    at the age of 48    SURGICAL HISTORY: Past Surgical History:  Procedure Laterality Date   BREAST LUMPECTOMY WITH RADIOACTIVE SEED LOCALIZATION Left 05/11/2018   Procedure: BREAST LUMPECTOMY WITH RADIOACTIVE SEED LOCALIZATION;  Surgeon: Stark Klein, MD;  Location: [facility];  Service: General;  Laterality: Left;   carotid surgery     TUBAL LIGATION      SOCIAL HISTORY: Social History   Socioeconomic History   Marital status: Single    Spouse name: Not on  file   Number of children: Not on file   Years of education: Not on file   Highest education level: Not on file  Occupational History   Not on file  Tobacco Use   Smoking status: Never   Smokeless tobacco: Never  Vaping Use   Vaping Use: Never used  Substance and Sexual Activity   Alcohol use: Not Currently   Drug use: Never   Sexual activity: Not  Currently  Other Topics Concern   Not on file  Social History Narrative   Not on file   Social Determinants of Health   Financial Resource Strain: Not on file  Food Insecurity: Not on file  Transportation Needs: No Transportation Needs (05/27/2018)   PRAPARE - Hydrologist (Medical): No    Lack of Transportation (Non-Medical): No  Physical Activity: Not on file  Stress: Not on file  Social Connections: Not on file  Intimate Partner Violence: Not At Risk (05/27/2018)   Humiliation, Afraid, Rape, and Kick questionnaire    Fear of Current or Ex-Partner: No    Emotionally Abused: No    Physically Abused: No    Sexually Abused: No    FAMILY HISTORY: Family History  Problem Relation Age of Onset   Colon cancer Paternal Uncle    Breast cancer Sister 65    Review of Systems  Constitutional:  Negative for appetite change, chills, fatigue, fever and unexpected weight change.  HENT:   Negative for hearing loss, lump/mass and trouble swallowing.   Eyes:  Negative for eye problems and icterus.  Respiratory:  Negative for chest tightness, cough and shortness of breath.   Cardiovascular:  Negative for chest pain, leg swelling and palpitations.  Gastrointestinal:  Negative for abdominal distention, abdominal pain, constipation, diarrhea, nausea and vomiting.  Endocrine: Negative for hot flashes.  Genitourinary:  Negative for difficulty urinating.   Musculoskeletal:  Negative for arthralgias.  Skin:  Negative for itching and rash.  Neurological:  Negative for dizziness, extremity weakness, headaches and numbness.  Hematological:  Negative for adenopathy. Does not bruise/bleed easily.  Psychiatric/Behavioral:  Negative for depression. The patient is not nervous/anxious.       PHYSICAL EXAMINATION  ECOG PERFORMANCE STATUS: 1 - Symptomatic but completely ambulatory  Vitals:   09/23/22 1021  BP: (!) 146/98  Pulse: (!) 126  Resp: 18  Temp: (!) 97.5 F  (36.4 C)  SpO2: 100%    Physical Exam Constitutional:      General: She is not in acute distress.    Appearance: Normal appearance. She is not toxic-appearing.  HENT:     Head: Normocephalic and atraumatic.  Eyes:     General: No scleral icterus. Cardiovascular:     Rate and Rhythm: Normal rate and regular rhythm.     Pulses: Normal pulses.     Heart sounds: Normal heart sounds.     Comments: Heart rate when auscultated was 96. Pulmonary:     Effort: Pulmonary effort is normal.     Breath sounds: Normal breath sounds.  Chest:     Comments: Right breast is benign left breast is status postlumpectomy.  There are no nodules or masses palpated however her nipple is erythematous and scaly in appearance, see picture below--the patient didn't realize this was present and is unsure how long it has been there. Abdominal:     General: Abdomen is flat. Bowel sounds are normal. There is no distension.     Palpations: Abdomen is soft.  Tenderness: There is no abdominal tenderness.  Musculoskeletal:        General: No swelling.     Cervical back: Neck supple.  Lymphadenopathy:     Cervical: No cervical adenopathy.  Skin:    General: Skin is warm and dry.     Findings: No rash.  Neurological:     General: No focal deficit present.     Mental Status: She is alert.  Psychiatric:        Mood and Affect: Mood normal.        Behavior: Behavior normal.   Left nipple on 09/23/2022   LABORATORY DATA: None for this visit    ASSESSMENT and THERAPY PLAN:   Ductal carcinoma in situ (DCIS) of left breast Cynthia Peterson is an 82 year old woman with stage 0 ER/PR negative breast cancer that was diagnosed in July 2019.  She is status postlumpectomy and adjuvant radiation that she completed in November 2019.  Cynthia Peterson is doing well and her mammograms have remained negative she has had no signs of recurrence to this point.  She does have a left nipple discoloration which in some cases can be an early  sign of Paget's disease.  I have taken a picture of this and will send to Dr. Barry Dienes to get her thoughts on this.  Cynthia Peterson will see Dr. Barry Dienes in 6 months for routine follow-up and breast exam.  We will not need to see her back so long as the left nipple is cleared by Dr. Barry Dienes.    At the time of her next appointment she will be more than 5 years out therefore she will follow-up with primary care annually for her breast exams we will continue annual mammograms and will return as needed for any issues that may arise.    All questions were answered. The patient knows to call the clinic with any problems, questions or concerns. We can certainly see the patient much sooner if necessary.  Total encounter time:30 minutes*in face-to-face visit time, chart review, lab review, care coordination, order entry, and documentation of the encounter time.  Cynthia Bihari, NP 09/23/22 11:30 AM Medical Oncology and Hematology [facility] [place], [place] [address] Tel. [phone]    Fax. [phone]  *Total Encounter Time as defined by the Centers for Medicare and Medicaid Services includes, in addition to the face-to-face time of a patient visit (documented in the note above) non-face-to-face time: obtaining and reviewing outside history, ordering and reviewing medications, tests or procedures, care coordination (communications with other health care professionals or caregivers) and documentation in the medical record.

## 2022-09-23 NOTE — Assessment & Plan Note (Signed)
Cynthia Peterson is an 82 year old woman with stage 0 ER/PR negative breast cancer that was diagnosed in July 2019.  She is status postlumpectomy and adjuvant radiation that she completed in November 2019.  Cynthia Peterson is doing well and her mammograms have remained negative she has had no signs of recurrence to this point.  She does have a left nipple discoloration which in some cases can be an early sign of Paget's disease.  I have taken a picture of this and will send to Dr. Barry Dienes to get her thoughts on this.  Cynthia Peterson will see Dr. Barry Dienes in 6 months for routine follow-up and breast exam.  We will not need to see her back so long as the left nipple is cleared by Dr. Barry Dienes.    At the time of her next appointment she will be more than 5 years out therefore she will follow-up with primary care annually for her breast exams we will continue annual mammograms and will return as needed for any issues that may arise.

## 2022-09-25 ENCOUNTER — Encounter: Payer: Self-pay | Admitting: Adult Health

## 2022-10-16 DIAGNOSIS — Z8673 Personal history of transient ischemic attack (TIA), and cerebral infarction without residual deficits: Secondary | ICD-10-CM | POA: Diagnosis not present

## 2022-10-16 DIAGNOSIS — I779 Disorder of arteries and arterioles, unspecified: Secondary | ICD-10-CM | POA: Diagnosis not present

## 2022-10-16 DIAGNOSIS — G43009 Migraine without aura, not intractable, without status migrainosus: Secondary | ICD-10-CM | POA: Diagnosis not present

## 2022-10-16 DIAGNOSIS — I10 Essential (primary) hypertension: Secondary | ICD-10-CM | POA: Diagnosis not present

## 2022-10-16 DIAGNOSIS — C50212 Malignant neoplasm of upper-inner quadrant of left female breast: Secondary | ICD-10-CM | POA: Diagnosis not present

## 2022-10-16 DIAGNOSIS — H5310 Unspecified subjective visual disturbances: Secondary | ICD-10-CM | POA: Diagnosis not present

## 2022-11-25 DIAGNOSIS — D225 Melanocytic nevi of trunk: Secondary | ICD-10-CM | POA: Diagnosis not present

## 2022-11-25 DIAGNOSIS — Z08 Encounter for follow-up examination after completed treatment for malignant neoplasm: Secondary | ICD-10-CM | POA: Diagnosis not present

## 2022-11-25 DIAGNOSIS — L570 Actinic keratosis: Secondary | ICD-10-CM | POA: Diagnosis not present

## 2022-11-25 DIAGNOSIS — Z85828 Personal history of other malignant neoplasm of skin: Secondary | ICD-10-CM | POA: Diagnosis not present

## 2022-11-25 DIAGNOSIS — L821 Other seborrheic keratosis: Secondary | ICD-10-CM | POA: Diagnosis not present

## 2022-11-25 DIAGNOSIS — L814 Other melanin hyperpigmentation: Secondary | ICD-10-CM | POA: Diagnosis not present

## 2023-05-05 DIAGNOSIS — G43009 Migraine without aura, not intractable, without status migrainosus: Secondary | ICD-10-CM | POA: Diagnosis not present

## 2023-05-05 DIAGNOSIS — Z23 Encounter for immunization: Secondary | ICD-10-CM | POA: Diagnosis not present

## 2023-05-05 DIAGNOSIS — I10 Essential (primary) hypertension: Secondary | ICD-10-CM | POA: Diagnosis not present

## 2023-05-05 DIAGNOSIS — Z8673 Personal history of transient ischemic attack (TIA), and cerebral infarction without residual deficits: Secondary | ICD-10-CM | POA: Diagnosis not present

## 2023-05-05 DIAGNOSIS — M1990 Unspecified osteoarthritis, unspecified site: Secondary | ICD-10-CM | POA: Diagnosis not present

## 2023-05-05 DIAGNOSIS — I779 Disorder of arteries and arterioles, unspecified: Secondary | ICD-10-CM | POA: Diagnosis not present

## 2023-05-05 DIAGNOSIS — M810 Age-related osteoporosis without current pathological fracture: Secondary | ICD-10-CM | POA: Diagnosis not present

## 2023-05-05 DIAGNOSIS — Z0000 Encounter for general adult medical examination without abnormal findings: Secondary | ICD-10-CM | POA: Diagnosis not present

## 2023-05-05 DIAGNOSIS — E785 Hyperlipidemia, unspecified: Secondary | ICD-10-CM | POA: Diagnosis not present

## 2023-05-27 ENCOUNTER — Other Ambulatory Visit: Payer: Self-pay | Admitting: Internal Medicine

## 2023-05-27 DIAGNOSIS — M810 Age-related osteoporosis without current pathological fracture: Secondary | ICD-10-CM

## 2023-05-28 DIAGNOSIS — Z85828 Personal history of other malignant neoplasm of skin: Secondary | ICD-10-CM | POA: Diagnosis not present

## 2023-05-28 DIAGNOSIS — L814 Other melanin hyperpigmentation: Secondary | ICD-10-CM | POA: Diagnosis not present

## 2023-05-28 DIAGNOSIS — D225 Melanocytic nevi of trunk: Secondary | ICD-10-CM | POA: Diagnosis not present

## 2023-05-28 DIAGNOSIS — Z08 Encounter for follow-up examination after completed treatment for malignant neoplasm: Secondary | ICD-10-CM | POA: Diagnosis not present

## 2023-05-28 DIAGNOSIS — L538 Other specified erythematous conditions: Secondary | ICD-10-CM | POA: Diagnosis not present

## 2023-05-28 DIAGNOSIS — B079 Viral wart, unspecified: Secondary | ICD-10-CM | POA: Diagnosis not present

## 2023-05-28 DIAGNOSIS — L821 Other seborrheic keratosis: Secondary | ICD-10-CM | POA: Diagnosis not present

## 2023-05-28 DIAGNOSIS — D492 Neoplasm of unspecified behavior of bone, soft tissue, and skin: Secondary | ICD-10-CM | POA: Diagnosis not present

## 2023-05-28 DIAGNOSIS — L570 Actinic keratosis: Secondary | ICD-10-CM | POA: Diagnosis not present

## 2023-06-02 DIAGNOSIS — Z1231 Encounter for screening mammogram for malignant neoplasm of breast: Secondary | ICD-10-CM | POA: Diagnosis not present

## 2023-11-03 DIAGNOSIS — E785 Hyperlipidemia, unspecified: Secondary | ICD-10-CM | POA: Diagnosis not present

## 2023-11-03 DIAGNOSIS — G43009 Migraine without aura, not intractable, without status migrainosus: Secondary | ICD-10-CM | POA: Diagnosis not present

## 2023-11-03 DIAGNOSIS — I10 Essential (primary) hypertension: Secondary | ICD-10-CM | POA: Diagnosis not present

## 2023-11-03 DIAGNOSIS — C50212 Malignant neoplasm of upper-inner quadrant of left female breast: Secondary | ICD-10-CM | POA: Diagnosis not present

## 2023-11-03 DIAGNOSIS — I779 Disorder of arteries and arterioles, unspecified: Secondary | ICD-10-CM | POA: Diagnosis not present

## 2023-11-26 DIAGNOSIS — L814 Other melanin hyperpigmentation: Secondary | ICD-10-CM | POA: Diagnosis not present

## 2023-11-26 DIAGNOSIS — Z85828 Personal history of other malignant neoplasm of skin: Secondary | ICD-10-CM | POA: Diagnosis not present

## 2023-11-26 DIAGNOSIS — D225 Melanocytic nevi of trunk: Secondary | ICD-10-CM | POA: Diagnosis not present

## 2023-11-26 DIAGNOSIS — L821 Other seborrheic keratosis: Secondary | ICD-10-CM | POA: Diagnosis not present

## 2023-11-26 DIAGNOSIS — Z08 Encounter for follow-up examination after completed treatment for malignant neoplasm: Secondary | ICD-10-CM | POA: Diagnosis not present

## 2023-12-17 DIAGNOSIS — I10 Essential (primary) hypertension: Secondary | ICD-10-CM | POA: Diagnosis not present

## 2023-12-17 DIAGNOSIS — R079 Chest pain, unspecified: Secondary | ICD-10-CM | POA: Diagnosis not present

## 2023-12-17 DIAGNOSIS — Z8673 Personal history of transient ischemic attack (TIA), and cerebral infarction without residual deficits: Secondary | ICD-10-CM | POA: Diagnosis not present

## 2023-12-25 DIAGNOSIS — I10 Essential (primary) hypertension: Secondary | ICD-10-CM | POA: Diagnosis not present

## 2023-12-31 ENCOUNTER — Ambulatory Visit
Admission: RE | Admit: 2023-12-31 | Discharge: 2023-12-31 | Disposition: A | Discharge status: Home / Self Care | Payer: Medicare Other | Source: Ambulatory Visit | Attending: Internal Medicine | Admitting: Internal Medicine

## 2023-12-31 DIAGNOSIS — M8588 Other specified disorders of bone density and structure, other site: Secondary | ICD-10-CM | POA: Diagnosis not present

## 2023-12-31 DIAGNOSIS — M810 Age-related osteoporosis without current pathological fracture: Secondary | ICD-10-CM

## 2024-01-04 DIAGNOSIS — I10 Essential (primary) hypertension: Secondary | ICD-10-CM | POA: Diagnosis not present

## 2024-01-04 DIAGNOSIS — G43009 Migraine without aura, not intractable, without status migrainosus: Secondary | ICD-10-CM | POA: Diagnosis not present

## 2024-01-04 DIAGNOSIS — Z8673 Personal history of transient ischemic attack (TIA), and cerebral infarction without residual deficits: Secondary | ICD-10-CM | POA: Diagnosis not present

## 2024-01-04 DIAGNOSIS — E785 Hyperlipidemia, unspecified: Secondary | ICD-10-CM | POA: Diagnosis not present

## 2024-01-04 DIAGNOSIS — I779 Disorder of arteries and arterioles, unspecified: Secondary | ICD-10-CM | POA: Diagnosis not present

## 2024-05-16 DIAGNOSIS — E785 Hyperlipidemia, unspecified: Secondary | ICD-10-CM | POA: Diagnosis not present

## 2024-05-16 DIAGNOSIS — I779 Disorder of arteries and arterioles, unspecified: Secondary | ICD-10-CM | POA: Diagnosis not present

## 2024-05-16 DIAGNOSIS — K219 Gastro-esophageal reflux disease without esophagitis: Secondary | ICD-10-CM | POA: Diagnosis not present

## 2024-05-16 DIAGNOSIS — G43009 Migraine without aura, not intractable, without status migrainosus: Secondary | ICD-10-CM | POA: Diagnosis not present

## 2024-05-16 DIAGNOSIS — Z8673 Personal history of transient ischemic attack (TIA), and cerebral infarction without residual deficits: Secondary | ICD-10-CM | POA: Diagnosis not present

## 2024-05-16 DIAGNOSIS — I10 Essential (primary) hypertension: Secondary | ICD-10-CM | POA: Diagnosis not present

## 2024-05-16 DIAGNOSIS — Z0000 Encounter for general adult medical examination without abnormal findings: Secondary | ICD-10-CM | POA: Diagnosis not present

## 2024-05-16 DIAGNOSIS — M810 Age-related osteoporosis without current pathological fracture: Secondary | ICD-10-CM | POA: Diagnosis not present

## 2024-06-07 DIAGNOSIS — Z1231 Encounter for screening mammogram for malignant neoplasm of breast: Secondary | ICD-10-CM | POA: Diagnosis not present
